# Patient Record
Sex: Male | Born: 1940 | Race: White | Hispanic: No | Marital: Married | Smoking: Former smoker
Health system: Southern US, Community
[De-identification: ages and names within clinical notes are randomized; demographics above are authoritative.]

## PROBLEM LIST (undated history)

## (undated) DIAGNOSIS — K219 Gastro-esophageal reflux disease without esophagitis: Secondary | ICD-10-CM

## (undated) DIAGNOSIS — I10 Essential (primary) hypertension: Secondary | ICD-10-CM

## (undated) DIAGNOSIS — Z5189 Encounter for other specified aftercare: Secondary | ICD-10-CM

## (undated) DIAGNOSIS — H269 Unspecified cataract: Secondary | ICD-10-CM

## (undated) DIAGNOSIS — K635 Polyp of colon: Secondary | ICD-10-CM

## (undated) DIAGNOSIS — D649 Anemia, unspecified: Secondary | ICD-10-CM

## (undated) DIAGNOSIS — E785 Hyperlipidemia, unspecified: Secondary | ICD-10-CM

## (undated) DIAGNOSIS — C6290 Malignant neoplasm of unspecified testis, unspecified whether descended or undescended: Secondary | ICD-10-CM

## (undated) DIAGNOSIS — R413 Other amnesia: Secondary | ICD-10-CM

## (undated) HISTORY — DX: Essential (primary) hypertension: I10

## (undated) HISTORY — DX: Malignant neoplasm of unspecified testis, unspecified whether descended or undescended: C62.90

## (undated) HISTORY — DX: Hyperlipidemia, unspecified: E78.5

## (undated) HISTORY — DX: Unspecified cataract: H26.9

## (undated) HISTORY — DX: Polyp of colon: K63.5

## (undated) HISTORY — DX: Gastro-esophageal reflux disease without esophagitis: K21.9

## (undated) HISTORY — PX: INGUINAL HERNIA REPAIR: SUR1180

## (undated) HISTORY — DX: Anemia, unspecified: D64.9

## (undated) HISTORY — PX: ORCHIECTOMY: SHX2116

## (undated) HISTORY — DX: Other amnesia: R41.3

## (undated) HISTORY — PX: CATARACT EXTRACTION, BILATERAL: SHX1313

## (undated) HISTORY — DX: Encounter for other specified aftercare: Z51.89

---

## 2009-09-27 ENCOUNTER — Encounter: Payer: Self-pay | Admitting: *Deleted

## 2017-10-22 ENCOUNTER — Encounter: Payer: Self-pay | Admitting: Gastroenterology

## 2018-05-14 ENCOUNTER — Encounter: Payer: Self-pay | Admitting: Cardiology

## 2018-05-14 NOTE — Progress Notes (Signed)
Cardiology Office Note   Date:  05/15/2018   ID:  Paul Kelley, DOB 1940-04-19, MRN [id number]  PCP:  Orpah Greek, MD  Cardiologist:   No primary care provider on file. Referring:  Self  No chief complaint on file.     History of Present Illness: Paul Kelley is a 78 y.o. male who is referred by himself for evaluation of shoulder pain.   The patient has had no past cardiac history.  However, he has had stress test in the past because of risk factors and an abnormal EKG by report.  He describes bilateral shoulder discomfort at times.  This is sharp and shooting.  Seems to be sporadic.  He has been evaluated with stress testing and I was able to review outside records that demonstrate a perfusion study with no evidence of ischemia with the most recent being done in 2018.  He also had an ejection fraction that was normal and an echocardiogram that was unremarkable.  He says his symptoms have not changed since that time.  He develops bilateral shoulder discomfort.  He feels some sharp discomfort and it seems to be intermittent and lasting for less than an hour.  He does not describe resting shortness of breath that is substernal.  He has no associated nausea vomiting. He will continue patient no shortness of breath, PND or orthopnea.  He has no weight gain or edema.    Past Medical History:  Diagnosis Date  . GERD (gastroesophageal reflux disease)   . HTN (hypertension)   . Hyperlipidemia   . Pneumonia   . 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 INGUINAL HERNIA REPAIR    . ORCHIECTOMY        Current Outpatient Medications  Medication Sig Dispense Refill  . aspirin EC 81 MG tablet Take 81 mg by mouth daily.    Marland Kitchen lisinopril-hydrochlorothiazide (ZESTORETIC) 20-25 MG tablet Take 1 tablet by mouth daily.    . simvastatin (ZOCOR) 40 MG tablet Take 40 mg by mouth every evening.     No current facility-administered medications  for this visit.     Allergies:   Patient has no allergy information on record.    Social History:  The patient  reports that he quit smoking about 50 years ago. His smoking use included cigarettes. He has never used smokeless tobacco. He reports previous alcohol use.   Family History:  The patient's family history is not on file.    ROS:  Please see the history of present illness.   Otherwise, review of systems are positive for none.   All other systems are reviewed and negative.    PHYSICAL EXAM: VS:  BP (!) 147/71   Pulse (!) 53   Ht 5\' 9"  (1.753 m)   Wt 222 lb 12.8 oz (101.1 kg)   BMI 32.90 kg/m  , BMI Body mass index is 32.9 kg/m. GENERAL:  Well appearing HEENT:  Pupils equal round and reactive, fundi not visualized, oral mucosa unremarkable NECK:  No jugular venous distention, waveform within normal limits, carotid upstroke brisk and symmetric, no bruits, no thyromegaly LYMPHATICS:  No cervical, inguinal adenopathy LUNGS:  Clear to auscultation bilaterally BACK:  No CVA tenderness CHEST:  Unremarkable HEART:  PMI not displaced or sustained,S1 and S2 within normal limits, no S3, no S4, no clicks, no rubs, no murmurs ABD:  Flat, positive bowel sounds normal in frequency in pitch, no  bruits, no rebound, no guarding, no midline pulsatile mass, no hepatomegaly, no splenomegaly EXT:  2 plus pulses throughout, no edema, no cyanosis no clubbing SKIN:  No rashes no nodules NEURO:  Cranial nerves II through XII grossly intact, motor grossly intact throughout PSYCH:  Cognitively intact, oriented to person place and time    EKG:  EKG is ordered today. The ekg ordered today demonstrates sinus rhythm, rate 53, axis within normal limits, intervals within normal limits, no acute ST-T wave changes.   Recent Labs: No results found for requested labs within last 8760 hours.    Lipid Panel No results found for: CHOL, TRIG, HDL, CHOLHDL, VLDL, LDLCALC, LDLDIRECT    Wt Readings from  Last 3 Encounters:  05/15/18 222 lb 12.8 oz (101.1 kg)      Other studies Reviewed: Additional studies/ records that were reviewed today include: Outside records as above.. Review of the above records demonstrates:  Please see elsewhere in the note.     ASSESSMENT AND PLAN:  ABNORMAL EKG: The patient has had the bilateral shoulder discomfort as described.  I do not see an abnormal EKG with his records earlier today.  He has had his symptoms thoroughly evaluated.  He has had negative stress testing recorded above.  The symptoms are quite atypical.  His exam is unremarkable.  Given this I think the pretest probability of obstructive coronary disease as the etiology to his complaints is very low.  I would agree that the testing represents a true negative and that no further cardiovascular testing is suggested.  HTN: His blood pressure is slightly abnormal and I suggested weight loss along with the medications as listed.  OBESITY: We did talk about weight loss as above.   Current medicines are reviewed at length with the patient today.  The patient does not have concerns regarding medicines.  The following changes have been made:  no change  Labs/ tests ordered today include:  No orders of the defined types were placed in this encounter.    Disposition:   FU with me as needed     Signed, Minus Breeding, MD  05/15/2018 3:41 PM    [facility]

## 2018-05-15 ENCOUNTER — Encounter: Payer: Self-pay | Admitting: Cardiology

## 2018-05-15 ENCOUNTER — Other Ambulatory Visit: Payer: Self-pay

## 2018-05-15 ENCOUNTER — Ambulatory Visit (INDEPENDENT_AMBULATORY_CARE_PROVIDER_SITE_OTHER): Payer: Medicare Other | Admitting: Cardiology

## 2018-05-15 VITALS — BP 147/71 | HR 53 | Ht 69.0 in | Wt 222.8 lb

## 2018-05-15 DIAGNOSIS — M25511 Pain in right shoulder: Secondary | ICD-10-CM

## 2018-05-15 DIAGNOSIS — M25512 Pain in left shoulder: Secondary | ICD-10-CM

## 2018-05-15 DIAGNOSIS — G8929 Other chronic pain: Secondary | ICD-10-CM | POA: Insufficient documentation

## 2018-05-15 DIAGNOSIS — I10 Essential (primary) hypertension: Secondary | ICD-10-CM | POA: Diagnosis not present

## 2018-05-15 NOTE — Patient Instructions (Signed)

## 2018-09-24 ENCOUNTER — Encounter: Payer: Self-pay | Admitting: Gastroenterology

## 2018-09-24 ENCOUNTER — Telehealth: Payer: Self-pay | Admitting: Gastroenterology

## 2018-09-24 NOTE — Telephone Encounter (Signed)
Consult scheduled on 10/9 at 9:50am.

## 2018-09-24 NOTE — Telephone Encounter (Signed)
Hi Dr. Rush Landmark, we have received Gi records from this patient. He would like to switch his GI care to you because his wife is also a patient of yours. He had a colon last October 2019 and he states that he is still dealing with bowel problems. Records will be placed on your desk for review. Please advise on scheduling.

## 2018-09-24 NOTE — Telephone Encounter (Signed)
I am willing to see patient in a transfer of care. His records have been partially reviewed and should be available at time of clinic visit.  2019 colonoscopy With diverticulosis in the sigmoid colon. 2 sessile nonbleeding polyps in the cecum removed piecemeal with forceps. Small nonbleeding grade 1 hemorrhoids.  Justice Britain, MD [facility] Office # [phone]

## 2018-10-30 ENCOUNTER — Encounter: Payer: Self-pay | Admitting: Gastroenterology

## 2018-10-30 ENCOUNTER — Other Ambulatory Visit: Payer: Self-pay

## 2018-10-30 ENCOUNTER — Other Ambulatory Visit (INDEPENDENT_AMBULATORY_CARE_PROVIDER_SITE_OTHER): Payer: Medicare Other

## 2018-10-30 ENCOUNTER — Ambulatory Visit (INDEPENDENT_AMBULATORY_CARE_PROVIDER_SITE_OTHER)
Admission: RE | Admit: 2018-10-30 | Discharge: 2018-10-30 | Disposition: A | Discharge status: Home / Self Care | Payer: Medicare Other | Source: Ambulatory Visit | Attending: Gastroenterology | Admitting: Gastroenterology

## 2018-10-30 ENCOUNTER — Ambulatory Visit (INDEPENDENT_AMBULATORY_CARE_PROVIDER_SITE_OTHER): Payer: Medicare Other | Admitting: Gastroenterology

## 2018-10-30 VITALS — BP 124/78 | HR 76 | Temp 98.2°F | Ht 66.5 in | Wt 226.0 lb

## 2018-10-30 DIAGNOSIS — D649 Anemia, unspecified: Secondary | ICD-10-CM | POA: Diagnosis not present

## 2018-10-30 DIAGNOSIS — Z862 Personal history of diseases of the blood and blood-forming organs and certain disorders involving the immune mechanism: Secondary | ICD-10-CM

## 2018-10-30 DIAGNOSIS — R140 Abdominal distension (gaseous): Secondary | ICD-10-CM

## 2018-10-30 DIAGNOSIS — R0789 Other chest pain: Secondary | ICD-10-CM | POA: Diagnosis not present

## 2018-10-30 DIAGNOSIS — Z8719 Personal history of other diseases of the digestive system: Secondary | ICD-10-CM

## 2018-10-30 DIAGNOSIS — K649 Unspecified hemorrhoids: Secondary | ICD-10-CM

## 2018-10-30 DIAGNOSIS — Z860100 Personal history of colon polyps, unspecified: Secondary | ICD-10-CM

## 2018-10-30 DIAGNOSIS — K219 Gastro-esophageal reflux disease without esophagitis: Secondary | ICD-10-CM | POA: Diagnosis not present

## 2018-10-30 DIAGNOSIS — Z87448 Personal history of other diseases of urinary system: Secondary | ICD-10-CM

## 2018-10-30 DIAGNOSIS — R413 Other amnesia: Secondary | ICD-10-CM

## 2018-10-30 LAB — CBC
HCT: 38.5 % — ABNORMAL LOW (ref 39.0–52.0)
Hemoglobin: 12.6 g/dL — ABNORMAL LOW (ref 13.0–17.0)
MCHC: 32.7 g/dL (ref 30.0–36.0)
MCV: 80.5 fl (ref 78.0–100.0)
Platelets: 243 10*3/uL (ref 150.0–400.0)
RBC: 4.78 Mil/uL (ref 4.22–5.81)
RDW: 15.2 % (ref 11.5–15.5)
WBC: 7.7 10*3/uL (ref 4.0–10.5)

## 2018-10-30 LAB — BASIC METABOLIC PANEL
BUN: 19 mg/dL (ref 6–23)
CO2: 30 mEq/L (ref 19–32)
Calcium: 11.1 mg/dL — ABNORMAL HIGH (ref 8.4–10.5)
Chloride: 98 mEq/L (ref 96–112)
Creatinine, Ser: 1.04 mg/dL (ref 0.40–1.50)
GFR: 69.05 mL/min (ref >60.00)
Glucose, Bld: 101 mg/dL — ABNORMAL HIGH (ref 70–99)
Potassium: 3.9 mEq/L (ref 3.5–5.1)
Sodium: 139 mEq/L (ref 135–145)

## 2018-10-30 LAB — PROTIME-INR
INR: 1.1 ratio — ABNORMAL HIGH (ref 0.8–1.0)
Prothrombin Time: 12.6 s (ref 9.6–13.1)

## 2018-10-30 LAB — IBC + FERRITIN
Ferritin: 8 ng/mL — ABNORMAL LOW (ref 22.0–322.0)
Iron: 47 ug/dL (ref 42–165)
Saturation Ratios: 9.3 % — ABNORMAL LOW (ref 20.0–50.0)
Transferrin: 360 mg/dL (ref 212.0–360.0)

## 2018-10-30 LAB — TSH: TSH: 1.37 u[IU]/mL (ref 0.35–4.50)

## 2018-10-30 LAB — LIPASE: Lipase: 13 U/L (ref 11.0–59.0)

## 2018-10-30 LAB — VITAMIN B12: Vitamin B-12: 153 pg/mL — ABNORMAL LOW (ref 211–911)

## 2018-10-30 LAB — FOLATE: Folate: 16.9 ng/mL (ref >5.9)

## 2018-10-30 NOTE — Progress Notes (Signed)
[facility] VISIT   Primary Care Provider Orpah Greek, MD [address] [place] [address] [phone]  Referring Provider Orpah Greek, MD [address], [place] [address] [phone]  Patient Profile: Paul Kelley is a 78 y.o. male with a pmh significant for hypertension, hyperlipidemia, colon polyps (adenomatous), chronic constipation, hemorrhoids, testicular cancer, status post AAA repair.  The patient presents to the [facility] for an evaluation and management of problem(s) noted below:  Problem List 1. Hx of constipation   2. Abdominal distention   3. Bloating   4. Gastroesophageal reflux disease, unspecified whether esophagitis present   5. Atypical chest pain   6. History of anemia   7. Hemorrhoids, unspecified hemorrhoid type   8. Memory loss   9. History of colonic polyps     History of Present Illness This is the patient's first visit to the outpatient [facility] clinic.  He is the husband of 1 of my patients and decided to transfer his care from [place] to [place] in an effort of trying to further optimize his medical health.  The patient's memory is not great and he himself is not able to give to significant amount of history but he is by himself today.  From what I can gather the patient deals with issues of acid reflux and heartburn as well as increased issues of belching and bloating.  He has had chest discomfort at times for which he has taken antacids and that improves the discomfort.  This occurs intermittently over the course of the week.  It is not associated with exertional chest pain.  He has had longstanding constipation and uses the restroom 1-2 times per week.  When the patient begins to try and have a bowel movement he sits for quite a while on the toilet and he may go back several times if nothing comes out.  At times when he gets so hard at trying to have a bowel movement and  straining he may get dizzy clammy and feel faint.  He may have fecal urgency, again and finally will have a bowel movement with significant relief.  He will end up having improvement in bowel distention and bloating once he has his bowel movement.  He has used MiraLAX but not on a consistent basis.  He does not use nonsteroidals on a frequent basis.  He has had an upper endoscopy many years ago.  He has had multiple colonoscopies in the last 10 years as documented below.  GI Review of Systems Positive as above Negative for odynophagia, dysphagia, decreased appetite, early satiety, melena, hematochezia  Review of Systems General: Denies fevers/chills/weight loss HEENT: Denies oral lesions Cardiovascular: Denies chest pain/palpitations Pulmonary: Denies shortness of breath/nocturnal cough Gastroenterological: See HPI Genitourinary: Denies darkened urine  Hematological: Denies easy bruising/bleeding Endocrine: Denies temperature intolerance Dermatological: Denies jaundice Psychological: Mood is stable   Medications Current Outpatient Medications  Medication Sig Dispense Refill   aspirin EC 81 MG tablet Take 81 mg by mouth daily.     lisinopril-hydrochlorothiazide (ZESTORETIC) 20-25 MG tablet Take 1 tablet by mouth daily.     simvastatin (ZOCOR) 40 MG tablet Take 40 mg by mouth every evening.     STOOL SOFTENER 100 MG capsule Take 200 mg by mouth daily.     No current facility-administered medications for this visit.     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   Past Surgical History:  Procedure Laterality Date   INGUINAL HERNIA REPAIR     ORCHIECTOMY     Social History   Socioeconomic History   Marital status: Married    Spouse name: Not on file   Number of children: 3   Years  of education: Not on file   Highest education level: Not on file  Occupational History   Occupation: retired  Scientist, product/process development strain: Not on file   Food insecurity    Worry: Not on file    Inability: Not on file   Transportation needs    Medical: Not on file    Non-medical: Not on file  Tobacco Use   Smoking status: Former Smoker    Types: Cigarettes    Quit date: 05/14/1968    Years since quitting: 50.5   Smokeless tobacco: Never Used  Substance and Sexual Activity   Alcohol use: Not Currently    Frequency: Never   Drug use: Not on file   Sexual activity: Not on file  Lifestyle   Physical activity    Days per week: Not on file    Minutes per session: Not on file   Stress: Not on file  Relationships   Social connections    Talks on phone: Not on file    Gets together: Not on file    Attends religious service: Not on file    Active member of club or organization: Not on file    Attends meetings of clubs or organizations: Not on file    Relationship status: Not on file   Intimate partner violence    Fear of current or ex partner: Not on file    Emotionally abused: Not on file    Physically abused: Not on file    Forced sexual activity: Not on file  Other Topics Concern   Not on file  Social History Narrative   Not on file   Family History  Problem Relation Age of Onset   Liver cancer Mother    Heart disease Father 8       MI   Diabetes Father    Liver cancer Sister    Cancer Brother        type unknown   Heart disease Brother 74       MI   Colon cancer Neg Hx    Esophageal cancer Neg Hx    Inflammatory bowel disease Neg Hx    Pancreatic cancer Neg Hx    Rectal cancer Neg Hx    Stomach cancer Neg Hx    I have reviewed his medical, social, and family history in detail and updated the electronic medical record as necessary.    PHYSICAL EXAMINATION  BP 124/78 (BP Location: Left Arm, Patient Position: Sitting,  Cuff Size: Normal)    Pulse 76    Temp 98.2 F (36.8 C)    Ht 5' 6.5" (1.689 m) Comment: height measured without shoes   Wt 226 lb (102.5 kg)    BMI 35.93 kg/m  Wt Readings from Last 3 Encounters:  10/30/18 226 lb (102.5 kg)  05/15/18 222 lb 12.8 oz (101.1 kg)  GEN: NAD, appears stated age, doesn't appear chronically ill PSYCH: Cooperative, without pressured speech EYE: Conjunctivae pink, sclerae anicteric ENT: MMM, without oral ulcers, no erythema or exudates noted  NECK: Supple CV: RR without R/Gs  RESP: CTAB posteriorly, without wheezing GI: NABS, soft, protuberant, distended, midline surgical scar present, ventral hernia present, without rebound or guarding, unable to appreciate hepatosplenomegaly due to body habitus GU: Perineal exam suggests external hemorrhoids, internal hemorrhoids grade 2, digital rectal exam suggests normal perineal descent and tone, no palpable rectal masses MSK/EXT: Trace bilateral lower extremity edema SKIN: No jaundice NEURO:  Alert & Oriented x 3, no focal deficits   REVIEW OF DATA  I reviewed the following data at the time of this encounter:  GI Procedures and Studies  March 2008 colonoscopy 10 mm cecal polyp removed.  October 2019 colonoscopy Diverticulosis in sigmoid colon of moderate severity. 2 sessile nonbleeding polyps of benign appearance ranging in 2 to 3 mm size of the cecum status post piecemeal polypectomy cold forceps.  Small nonbleeding grade 1 internal hemorrhoids.  Reported history of adenomatous polyps on colonoscopy in 2008, 2011, 2016, 2019.  Laboratory Studies  No laboratories to review in epic  December 2019 outside laboratories scanned into chart WBC 6.2 Hemoglobin 12.4 Hematocrit 38.8 Platelets 225 MCV 85 Sodium 139 Potassium 4.0 Calcium 9.7 BUN/creatinine 16/1.0 AST/ALT 23/30 Alk phos 52 T bili 1.1 Albumin 4.5 Triglycerides 219  Imaging Studies  No relevant studies to review   ASSESSMENT  Mr. Grauberger is a 78  y.o. male with a pmh significant for hypertension, hyperlipidemia, colon polyps (adenomatous), chronic constipation, hemorrhoids, testicular cancer, status post AAA repair.  The patient is seen today for evaluation and management of:  1. Hx of constipation   2. Abdominal distention   3. Bloating   4. Gastroesophageal reflux disease, unspecified whether esophagitis present   5. Atypical chest pain   6. History of anemia   7. Hemorrhoids, unspecified hemorrhoid type   8. Memory loss   9. History of colonic polyps    The patient is hemodynamically stable.  He has multiple issues but we will try and summarize them as best we can.  Looks like he has had longstanding constipation for years.  We are going to optimize and make sure that he takes his MiraLAX as well as potentially Dulcolax and stool softeners on a scheduled basis.  We will give him a trial of Linzess 72 mcg and if he does well could consider increasing the dose to see if this helps.  I suspect he has abdominal bloating and discomfort and pain as a result of getting backed up.  We will get a KUB as well.  He has had a recent colonoscopy showing no obstructive lesion so no need to repeat that currently but will need colon polyp surveillance currently documented for 2024.  The patient's atypical chest discomfort is not clearly GERD related but could be.  He has improvement with antacid therapy.  We will consider the role of PPI therapy.  With the patient's history of anemia based on last laboratories that I can see I think it worthwhile for [place] to repeat and also make sure that he does have iron deficiency that would require an upper endoscopy to be performed.  If he has overt iron deficiency may consider the role of a diagnostic colon as well.  He has a history of memory loss per notation by his wife's documentation and they would like a referral to neurology which I think is reasonable we will place that as well.  All patient questions were  answered, to the best of my ability, and the patient agrees to the aforementioned plan  of action with follow-up as indicated.   PLAN  Laboratories as outlined below KUB to evaluate for fecal burden Constipation regimen #1 -Docusate 200 mg daily -MiraLAX 1-2 times daily and if no bowel movement after 48 hours  -Then Dulcolax 10 mg Constipation regimen #2 trial -Docusate 200 mg daily -Linzess 72 mcg daily Consider diagnostic endoscopic evaluation based on findings of anemia and iron deficiency Neurology referral   Orders Placed This Encounter  Procedures   DG Abd 2 Views   CBC   Basic Metabolic Panel (BMET)   Lipase   TSH   IBC + Ferritin   B12   Folate   INR/PT   Hepatitis C Antibody   Ambulatory referral to Neurology    New Prescriptions   No medications on file   Modified Medications   No medications on file    Planned Follow Up No follow-ups on file.   Justice Britain, MD [facility] Office # [phone]

## 2018-10-30 NOTE — Patient Instructions (Addendum)
Your provider has requested that you go to the basement level for lab work and [employer]  before leaving today. Press "B" on the elevator. The lab is located at the first door on the left as you exit the elevator.  If you are age 78 or older, your body mass index should be between 23-30. Your Body mass index is 35.93 kg/m. If this is out of the aforementioned range listed, please consider follow up with your Primary Care Provider.  If you are age 79 or younger, your body mass index should be between 19-25. Your Body mass index is 35.93 kg/m. If this is out of the aformentioned range listed, please consider follow up with your Primary Care Provider.   Use step stool under feet while having a bowel movement to improve angulation.   Use Miralax daily- disslove 17 grams in at least 8 ounces of water/ juice daily, then increase to twice daily as bowel movements change.   Add dulcolax 200mg  - twice daily if no bowel movement within 48 hours of taking Miralax.   Take one  linzess 72cmg by mouth after having bowel movement. (samples given).   We have sent referral to Neurology- their office will contact you with appointment.  Thank you for choosing me and [facility].  Dr. Rush Landmark

## 2018-10-31 ENCOUNTER — Encounter: Payer: Self-pay | Admitting: Gastroenterology

## 2018-11-01 DIAGNOSIS — R413 Other amnesia: Secondary | ICD-10-CM | POA: Insufficient documentation

## 2018-11-01 DIAGNOSIS — Z860100 Personal history of colon polyps, unspecified: Secondary | ICD-10-CM | POA: Insufficient documentation

## 2018-11-01 DIAGNOSIS — R0789 Other chest pain: Secondary | ICD-10-CM | POA: Insufficient documentation

## 2018-11-01 DIAGNOSIS — R140 Abdominal distension (gaseous): Secondary | ICD-10-CM | POA: Insufficient documentation

## 2018-11-01 DIAGNOSIS — K219 Gastro-esophageal reflux disease without esophagitis: Secondary | ICD-10-CM | POA: Insufficient documentation

## 2018-11-01 DIAGNOSIS — K649 Unspecified hemorrhoids: Secondary | ICD-10-CM | POA: Insufficient documentation

## 2018-11-01 DIAGNOSIS — Z8719 Personal history of other diseases of the digestive system: Secondary | ICD-10-CM | POA: Insufficient documentation

## 2018-11-01 DIAGNOSIS — Z862 Personal history of diseases of the blood and blood-forming organs and certain disorders involving the immune mechanism: Secondary | ICD-10-CM | POA: Insufficient documentation

## 2018-11-02 ENCOUNTER — Other Ambulatory Visit: Payer: Self-pay

## 2018-11-02 DIAGNOSIS — R140 Abdominal distension (gaseous): Secondary | ICD-10-CM

## 2018-11-02 DIAGNOSIS — Z8719 Personal history of other diseases of the digestive system: Secondary | ICD-10-CM

## 2018-11-02 DIAGNOSIS — K219 Gastro-esophageal reflux disease without esophagitis: Secondary | ICD-10-CM

## 2018-11-02 DIAGNOSIS — Z862 Personal history of diseases of the blood and blood-forming organs and certain disorders involving the immune mechanism: Secondary | ICD-10-CM

## 2018-11-02 LAB — HEPATITIS C ANTIBODY
Hepatitis C Ab: NONREACTIVE
SIGNAL TO CUT-OFF: 0.01 (ref <1.00)

## 2018-11-02 MED ORDER — FERROUS GLUCONATE 324 (38 FE) MG PO TABS: 324.0000 mg | ORAL_TABLET | Freq: Every day | ORAL | 3 refills | Status: AC

## 2018-11-02 NOTE — Progress Notes (Signed)
Rx for iron sent to pharmacy 

## 2018-11-23 ENCOUNTER — Telehealth: Payer: Self-pay | Admitting: Gastroenterology

## 2018-11-23 NOTE — Telephone Encounter (Signed)
The pt's wife called to say that they forgot to start Iron and wanted to know if they could wait a few weeks to have labs done.  He has started iron now.  They will come in around the 20th of November. They will be in the area at that time. FYI Dr Rush Landmark

## 2018-11-24 NOTE — Telephone Encounter (Signed)
That will be fine. Thank you. GM

## 2018-12-09 ENCOUNTER — Other Ambulatory Visit (INDEPENDENT_AMBULATORY_CARE_PROVIDER_SITE_OTHER): Payer: Medicare Other

## 2018-12-09 DIAGNOSIS — R140 Abdominal distension (gaseous): Secondary | ICD-10-CM

## 2018-12-09 DIAGNOSIS — Z8719 Personal history of other diseases of the digestive system: Secondary | ICD-10-CM | POA: Diagnosis not present

## 2018-12-09 DIAGNOSIS — Z862 Personal history of diseases of the blood and blood-forming organs and certain disorders involving the immune mechanism: Secondary | ICD-10-CM | POA: Diagnosis not present

## 2018-12-09 DIAGNOSIS — K219 Gastro-esophageal reflux disease without esophagitis: Secondary | ICD-10-CM | POA: Diagnosis not present

## 2018-12-09 LAB — IBC + FERRITIN
Ferritin: 22.2 ng/mL (ref 22.0–322.0)
Iron: 56 ug/dL (ref 42–165)
Saturation Ratios: 13.1 % — ABNORMAL LOW (ref 20.0–50.0)
Transferrin: 305 mg/dL (ref 212.0–360.0)

## 2018-12-09 LAB — COMPREHENSIVE METABOLIC PANEL
ALT: 12 U/L (ref 0–53)
AST: 14 U/L (ref 0–37)
Albumin: 4.2 g/dL (ref 3.5–5.2)
Alkaline Phosphatase: 52 U/L (ref 39–117)
BUN: 19 mg/dL (ref 6–23)
CO2: 32 mEq/L (ref 19–32)
Calcium: 9.7 mg/dL (ref 8.4–10.5)
Chloride: 98 mEq/L (ref 96–112)
Creatinine, Ser: 1.03 mg/dL (ref 0.40–1.50)
GFR: 69.81 mL/min (ref >60.00)
Glucose, Bld: 135 mg/dL — ABNORMAL HIGH (ref 70–99)
Potassium: 3.6 mEq/L (ref 3.5–5.1)
Sodium: 137 mEq/L (ref 135–145)
Total Bilirubin: 0.6 mg/dL (ref 0.2–1.2)
Total Protein: 7.7 g/dL (ref 6.0–8.3)

## 2018-12-09 LAB — CBC
HCT: 38.8 % — ABNORMAL LOW (ref 39.0–52.0)
Hemoglobin: 12.8 g/dL — ABNORMAL LOW (ref 13.0–17.0)
MCHC: 33.1 g/dL (ref 30.0–36.0)
MCV: 80.9 fl (ref 78.0–100.0)
Platelets: 225 10*3/uL (ref 150.0–400.0)
RBC: 4.8 Mil/uL (ref 4.22–5.81)
RDW: 15.9 % — ABNORMAL HIGH (ref 11.5–15.5)
WBC: 6.6 10*3/uL (ref 4.0–10.5)

## 2018-12-09 LAB — VITAMIN B12: Vitamin B-12: 1450 pg/mL — ABNORMAL HIGH (ref 211–911)

## 2018-12-10 ENCOUNTER — Other Ambulatory Visit: Payer: Self-pay

## 2018-12-10 DIAGNOSIS — D509 Iron deficiency anemia, unspecified: Secondary | ICD-10-CM

## 2018-12-10 LAB — CALCIUM, IONIZED: Calcium, Ion: 5.22 mg/dL (ref 4.8–5.6)

## 2018-12-10 LAB — RETICULOCYTES
ABS Retic: 57600 cells/uL (ref 25000–9000)
Retic Ct Pct: 1.2 %

## 2018-12-24 ENCOUNTER — Other Ambulatory Visit: Payer: Self-pay

## 2018-12-24 ENCOUNTER — Ambulatory Visit (INDEPENDENT_AMBULATORY_CARE_PROVIDER_SITE_OTHER): Payer: Medicare Other | Admitting: Neurology

## 2018-12-24 ENCOUNTER — Encounter: Payer: Self-pay | Admitting: Neurology

## 2018-12-24 VITALS — BP 126/68 | HR 59 | Temp 97.4°F | Ht 66.5 in | Wt 225.0 lb

## 2018-12-24 DIAGNOSIS — R413 Other amnesia: Secondary | ICD-10-CM | POA: Diagnosis not present

## 2018-12-24 NOTE — Progress Notes (Signed)
PATIENT: Paul Kelley DOB: 1940/07/02  Chief Complaint  Patient presents with  . Memory Loss    MMSE 26/30 - 6 animals. He is here with his wife, Paul Kelley, to have his declining memory evaluated.  Marland Kitchen PCP    Orpah Greek, MD  . GI MD    Mansouraty, Telford Nab., MD - referring provider     HISTORICAL  Paul Kelley is a 78 year old male, seen in request by his primary care physician Dr. Orpah Greek for evaluation of memory loss, initial evaluation was with his wife Paul Kelley on December 24, 2018.  I have reviewed and summarized the referring note from the referring physician.  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  He denies difficulty handling his job at that time, there was no family history of memory loss  He is going through a lot of stress, both his wife, and daughter are combating stage IV cancer, he was noted to have word finding difficulties, difficulty completing his sentences, when he read, he described difficulty concentrate, and retaining information's.  He tends to repeat himself, he is much less active, sedentary most of the time.  He still drives his wife to chemotherapy every 2 weeks  He had a history of testicular cancer, status post surgery followed by chemotherapy, laboratory evaluation in November 2020 showed CBC, hemoglobin of 12.8, ferritin of 22, normal B12, TSH, CMP   REVIEW OF SYSTEMS: Full 14 system review of systems performed and notable only for as above All other review of systems were negative.  ALLERGIES: No Known Allergies  HOME MEDICATIONS: Current Outpatient Medications  Medication Sig Dispense Refill  . aspirin EC 81 MG tablet Take 81 mg by mouth daily.    . Cyanocobalamin (B-12 PO) Take 1 tablet by mouth daily.    . ferrous gluconate (FERGON) 324 MG tablet Take 1 tablet (324 mg total) by mouth daily with  breakfast. 30 tablet 3  . ferrous sulfate 325 (65 FE) MG EC tablet Take 325 mg by mouth daily.    Marland Kitchen lisinopril-hydrochlorothiazide (ZESTORETIC) 20-25 MG tablet Take 1 tablet by mouth daily.    . simvastatin (ZOCOR) 40 MG tablet Take 40 mg by mouth every evening.    Marland Kitchen STOOL SOFTENER 100 MG capsule Take 200 mg by mouth daily.     No current facility-administered medications for this visit.     PAST MEDICAL HISTORY: Past Medical History:  Diagnosis Date  . Colon polyps   . GERD (gastroesophageal reflux disease)   . HTN (hypertension)   . Hyperlipidemia   . Memory loss   . Testicular cancer ([place])    36 years ago    PAST SURGICAL HISTORY: Past Surgical History:  Procedure Laterality Date  . INGUINAL HERNIA REPAIR    . ORCHIECTOMY      FAMILY HISTORY: Family History  Problem Relation Age of Onset  . Liver cancer Mother   . Heart disease Father 5       MI  . Diabetes Father   . Liver cancer Sister   . Cancer Brother        type unknown  . Heart disease Brother 59       MI  . Colon cancer Neg Hx   . Esophageal cancer Neg Hx   . Inflammatory bowel disease Neg Hx   . Pancreatic cancer Neg  Hx   . Rectal cancer Neg Hx   . Stomach cancer Neg Hx     SOCIAL HISTORY: Social History   Socioeconomic History  . Marital status: Married    Spouse name: Not on file  . Number of children: 3  . Years of education: 66  . Highest education level: High school graduate  Occupational History  . Occupation: retired  Scientific laboratory technician  . Financial resource strain: Not on file  . Food insecurity    Worry: Not on file    Inability: Not on file  . Transportation needs    Medical: Not on file    Non-medical: Not on file  Tobacco Use  . Smoking status: Former Smoker    Types: Cigarettes    Quit date: 05/14/1968    Years since quitting: 50.6  . Smokeless tobacco: Never Used  Substance and Sexual Activity  . Alcohol use: Not Currently    Frequency: Never  . Drug use: Never  . Sexual  activity: Not on file  Lifestyle  . Physical activity    Days per week: Not on file    Minutes per session: Not on file  . Stress: Not on file  Relationships  . Social Herbalist on phone: Not on file    Gets together: Not on file    Attends religious service: Not on file    Active member of club or organization: Not on file    Attends meetings of clubs or organizations: Not on file    Relationship status: Not on file  . Intimate partner violence    Fear of current or ex partner: Not on file    Emotionally abused: Not on file    Physically abused: Not on file    Forced sexual activity: Not on file  Other Topics Concern  . Not on file  Social History Narrative   Lives at home with his wife.   2-3 cups caffeine per day.   Right-handed.     PHYSICAL EXAM   Vitals:   12/24/18 1537  BP: 126/68  Pulse: (!) 59  Temp: (!) 97.4 F (36.3 C)  Weight: 225 lb (102.1 kg)  Height: 5' 6.5" (1.689 m)    Not recorded      Body mass index is 35.77 kg/m.  PHYSICAL EXAMNIATION:  Gen: NAD, conversant, well nourised, well groomed                     Cardiovascular: Regular rate rhythm, no peripheral edema, warm, nontender. Eyes: Conjunctivae clear without exudates or hemorrhage Neck: Supple, no carotid bruits. Pulmonary: Clear to auscultation bilaterally   NEUROLOGICAL EXAM:  MMSE - Mini Mental State Exam 12/24/2018  Orientation to time 4  Orientation to Place 5  Registration 3  Attention/ Calculation 5  Recall 0  Language- name 2 objects 2  Language- repeat 1  Language- follow 3 step command 3  Language- read & follow direction 1  Write a sentence 1  Copy design 1  Total score 26  Animal Naming 6   CRANIAL NERVES: CN II: Visual fields are full to confrontation.  Pupils are round equal and briskly reactive to light. CN III, IV, VI: extraocular movement are normal. No ptosis. CN V: Facial sensation is intact to pinprick in all 3 divisions bilaterally.  Corneal responses are intact.  CN VII: Face is symmetric with normal eye closure and smile. CN VIII: Hearing is normal to causal conversation. CN IX,  X: Palate elevates symmetrically. Phonation is normal. CN XI: Head turning and shoulder shrug are intact CN XII: Tongue is midline with normal movements and no atrophy.  MOTOR: There is no pronator drift of out-stretched arms. Muscle bulk and tone are normal. Muscle strength is normal.  REFLEXES: Reflexes are 1 and symmetric at the biceps, triceps, knees, and absent at ankles. Plantar responses are flexor.  SENSORY: Length dependent decreased to light touch, pinprick and vibratory sensation to mid shin level.  COORDINATION: There is no trunk or limb dysmetria noted.  GAIT/STANCE: He needs push-up to get up from seated position, cautious, difficulty perform tandem walking  DIAGNOSTIC DATA (LABS, IMAGING, TESTING) - I reviewed patient records, labs, notes, testing and imaging myself where available.   ASSESSMENT AND PLAN  Kingdon Lenzini is a 77 y.o. 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 Ph.D.  [facility] [address], [place], [place] [address] Ph: [phone] Fax: [phone]  CC: Orpah Greek, MD

## 2018-12-31 ENCOUNTER — Telehealth: Payer: Self-pay | Admitting: Neurology

## 2018-12-31 NOTE — Telephone Encounter (Signed)
Pts wife Lelon Frohlich is calling in wanting to know if his MRI can be done in [place] due to GI not having an opening until 01/06 and his appt with Dr Krista Blue on 01/04. They stated they do not want to wait a full month or more to be seen

## 2018-12-31 NOTE — Telephone Encounter (Signed)
Orders have been given to Dr. Krista Blue for signature. DW

## 2018-12-31 NOTE — Telephone Encounter (Signed)
I have faxed to the number provided.

## 2018-12-31 NOTE — Telephone Encounter (Signed)
Pt is requesting order to be sent to   [facility] [address] [place] [address] [phone]

## 2019-01-01 NOTE — Telephone Encounter (Signed)
Pt wife(on dpr-Ann Bledsoe) has called stating she has been told by [facility] that they have not received the orders from [facility].  Wife was informed of the entry from 4:05pm on 12-10.  Wife stated [facility] told her they went back even earlier in the day and have no record of the fax.  Wife asked if we could refax.  Wife was informed that unfortunately there is no one in that department until Monday morning.  Phone rep offered to send message as high priority to ensure a response early Monday, wife states this is preventing pt from getting scan, wife was apologized to that no one was available to resend the fax.

## 2019-01-04 NOTE — Telephone Encounter (Signed)
I called [facility] to confirm fax number, I have sent fax again. I called to confirm they received it but the imaging scheduling department does not open until 8am.  DW

## 2019-01-14 ENCOUNTER — Encounter: Payer: Self-pay | Admitting: Neurology

## 2019-01-25 ENCOUNTER — Ambulatory Visit (INDEPENDENT_AMBULATORY_CARE_PROVIDER_SITE_OTHER): Payer: Medicare Other | Admitting: Neurology

## 2019-01-25 ENCOUNTER — Other Ambulatory Visit: Payer: Self-pay

## 2019-01-25 ENCOUNTER — Other Ambulatory Visit: Payer: Medicare Other

## 2019-01-25 ENCOUNTER — Encounter: Payer: Self-pay | Admitting: Neurology

## 2019-01-25 VITALS — BP 133/63 | HR 81 | Temp 97.6°F | Ht 66.5 in | Wt 227.0 lb

## 2019-01-25 DIAGNOSIS — R413 Other amnesia: Secondary | ICD-10-CM | POA: Diagnosis not present

## 2019-01-25 MED ORDER — DONEPEZIL HCL 10 MG PO TABS: 10.0000 mg | ORAL_TABLET | Freq: Every day | ORAL | 4 refills | Status: AC

## 2019-01-25 MED ORDER — MEMANTINE HCL 10 MG PO TABS: 10.0000 mg | ORAL_TABLET | Freq: Two times a day (BID) | ORAL | 4 refills | Status: AC

## 2019-01-25 MED ORDER — DONEPEZIL HCL 10 MG PO TABS: 10.0000 mg | ORAL_TABLET | Freq: Every day | ORAL | 4 refills | Status: DC

## 2019-01-25 NOTE — Progress Notes (Addendum)
PATIENT: Paul Kelley DOB: 01/20/1941  Chief Complaint  Patient presents with  . Memory Loss    He is here with his wife, Paul Kelley. They would like to review the results his MRI brain.  Recent MMSE on 12/24/2018 was 26/30.     HISTORICAL  Paul Kelley is a 79 year old male, seen in request by his primary care physician Dr. Sabra Heck, Dominica Severin for evaluation of memory loss, initial evaluation was with his wife Paul Kelley on December 24, 2018.  I have reviewed and summarized the referring note from the referring physician.  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  He denies difficulty handling his job at that time, there was no family history of memory loss  He is going through a lot of stress, both his wife, and daughter are combating stage IV cancer, he was noted to have word finding difficulties, difficulty completing his sentences, when he read, he described difficulty concentrate, and retaining information's.  He tends to repeat himself, he is much less active, sedentary most of the time.  He still drives his wife to chemotherapy every 2 weeks  He had a history of testicular cancer, status post surgery followed by chemotherapy, laboratory evaluation in November 2020 showed CBC, hemoglobin of 12.8, ferritin of 22, normal B12, TSH, CMP  UPDATE Jan 4th 2021: He continue complains of mild memory loss, we personally reviewed MRI of the brain from [facility], mild generalized atrophy, no acute abnormality  He is under a lot of stress, both his wife and daughter suffered stage IV cancer  REVIEW OF SYSTEMS: Full 14 system review of systems performed and notable only for as above All other review of systems were negative.  ALLERGIES: No Known Allergies  HOME MEDICATIONS: Current Outpatient Medications  Medication Sig Dispense Refill  .  aspirin EC 81 MG tablet Take 81 mg by mouth daily.    . Cyanocobalamin (B-12 PO) Take 1 tablet by mouth daily.    . ferrous gluconate (FERGON) 324 MG tablet Take 1 tablet (324 mg total) by mouth daily with breakfast. 30 tablet 3  . ferrous sulfate 325 (65 FE) MG EC tablet Take 325 mg by mouth daily.    Marland Kitchen lisinopril-hydrochlorothiazide (ZESTORETIC) 20-25 MG tablet Take 1 tablet by mouth daily.    . simvastatin (ZOCOR) 40 MG tablet Take 40 mg by mouth every evening.    Marland Kitchen STOOL SOFTENER 100 MG capsule Take 200 mg by mouth daily.     No current facility-administered medications for this visit.    PAST MEDICAL HISTORY: Past Medical History:  Diagnosis Date  . Colon polyps   . GERD (gastroesophageal reflux disease)   . HTN (hypertension)   . Hyperlipidemia   . Memory loss   . Testicular cancer ([place])    36 years ago    PAST SURGICAL HISTORY: Past Surgical History:  Procedure Laterality Date  . INGUINAL HERNIA REPAIR    . ORCHIECTOMY      FAMILY HISTORY: Family History  Problem Relation Age of Onset  . Liver cancer Mother   . Heart disease Father 23       MI  . Diabetes Father   . Liver cancer Sister   . Cancer Brother        type unknown  . Heart disease Brother 77  MI  . Colon cancer Neg Hx   . Esophageal cancer Neg Hx   . Inflammatory bowel disease Neg Hx   . Pancreatic cancer Neg Hx   . Rectal cancer Neg Hx   . Stomach cancer Neg Hx     SOCIAL HISTORY: Social History   Socioeconomic History  . Marital status: Married    Spouse name: Not on file  . Number of children: 3  . Years of education: 82  . Highest education level: High school graduate  Occupational History  . Occupation: retired  Tobacco Use  . Smoking status: Former Smoker    Types: Cigarettes    Quit date: 05/14/1968    Years since quitting: 50.7  . Smokeless tobacco: Never Used  Substance and Sexual Activity  . Alcohol use: Not Currently  . Drug use: Never  . Sexual activity: Not on  file  Other Topics Concern  . Not on file  Social History Narrative   Lives at home with his wife.   2-3 cups caffeine per day.   Right-handed.   Social Determinants of Health   Financial Resource Strain:   . Difficulty of Paying Living Expenses: Not on file  Food Insecurity:   . Worried About Charity fundraiser in the Last Year: Not on file  . Ran Out of Food in the Last Year: Not on file  Transportation Needs:   . Lack of Transportation (Medical): Not on file  . Lack of Transportation (Non-Medical): Not on file  Physical Activity:   . Days of Exercise per Week: Not on file  . Minutes of Exercise per Session: Not on file  Stress:   . Feeling of Stress : Not on file  Social Connections:   . Frequency of Communication with Friends and Family: Not on file  . Frequency of Social Gatherings with Friends and Family: Not on file  . Attends Religious Services: Not on file  . Active Member of Clubs or Organizations: Not on file  . Attends Archivist Meetings: Not on file  . Marital Status: Not on file  Intimate Partner Violence:   . Fear of Current or Ex-Partner: Not on file  . Emotionally Abused: Not on file  . Physically Abused: Not on file  . Sexually Abused: Not on file     PHYSICAL EXAM   Vitals:   01/25/19 1516  BP: 133/63  Pulse: 81  Temp: 97.6 F (36.4 C)  Weight: 227 lb (103 kg)  Height: 5' 6.5" (1.689 m)    Not recorded      Body mass index is 36.09 kg/m.  PHYSICAL EXAMNIATION:  Gen: NAD, conversant, well nourised, well groomed                     Cardiovascular: Regular rate rhythm, no peripheral edema, warm, nontender. Eyes: Conjunctivae clear without exudates or hemorrhage Neck: Supple, no carotid bruits. Pulmonary: Clear to auscultation bilaterally   NEUROLOGICAL EXAM:  MMSE - Mini Mental State Exam 12/24/2018  Orientation to time 4  Orientation to Place 5  Registration 3  Attention/ Calculation 5  Recall 0  Language- name 2  objects 2  Language- repeat 1  Language- follow 3 step command 3  Language- read & follow direction 1  Write a sentence 1  Copy design 1  Total score 26  Animal Naming 6   CRANIAL NERVES: CN II: Visual fields are full to confrontation.  Pupils are round equal and briskly reactive  to light. CN III, IV, VI: extraocular movement are normal. No ptosis. CN V: Facial sensation is intact to pinprick in all 3 divisions bilaterally. Corneal responses are intact.  CN VII: Face is symmetric with normal eye closure and smile. CN VIII: Hearing is normal to causal conversation. CN IX, X: Palate elevates symmetrically. Phonation is normal. CN XI: Head turning and shoulder shrug are intact CN XII: Tongue is midline with normal movements and no atrophy.  MOTOR: There is no pronator drift of out-stretched arms. Muscle bulk and tone are normal. Muscle strength is normal.  REFLEXES: Reflexes are 1 and symmetric at the biceps, triceps, knees, and absent at ankles. Plantar responses are flexor.  SENSORY: Length dependent decreased to light touch, pinprick and vibratory sensation to mid shin level.  COORDINATION: There is no trunk or limb dysmetria noted.  GAIT/STANCE: He needs push-up to get up from seated position, cautious, difficulty perform tandem walking  DIAGNOSTIC DATA (LABS, IMAGING, TESTING) - I reviewed patient records, labs, notes, testing and imaging myself where available.   ASSESSMENT AND PLAN  Paul Kelley is a 79 y.o. 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 Ph.D.  [facility] [address] [place], [place] [address] Ph: [phone] Fax: [phone]  CC: Orpah Greek, MD  Addendum: MRI of the brain with without contrast from [facility] center on January 14, 2019, no acute abnormality, mild white matter T2 hyperintensity, consistent with small vessel disease, and age-related global atrophy.

## 2019-01-27 ENCOUNTER — Other Ambulatory Visit (INDEPENDENT_AMBULATORY_CARE_PROVIDER_SITE_OTHER): Payer: Medicare Other

## 2019-01-27 ENCOUNTER — Other Ambulatory Visit: Payer: Medicare Other

## 2019-01-27 ENCOUNTER — Telehealth: Payer: Self-pay | Admitting: Neurology

## 2019-01-27 DIAGNOSIS — D509 Iron deficiency anemia, unspecified: Secondary | ICD-10-CM

## 2019-01-27 LAB — CBC WITH DIFFERENTIAL/PLATELET
Basophils Absolute: 0.1 10*3/uL (ref 0.0–0.1)
Basophils Relative: 0.8 % (ref 0.0–3.0)
Eosinophils Absolute: 0.3 10*3/uL (ref 0.0–0.7)
Eosinophils Relative: 4.2 % (ref 0.0–5.0)
HCT: 38.1 % — ABNORMAL LOW (ref 39.0–52.0)
Hemoglobin: 12.4 g/dL — ABNORMAL LOW (ref 13.0–17.0)
Lymphocytes Relative: 29.4 % (ref 12.0–46.0)
Lymphs Abs: 1.9 10*3/uL (ref 0.7–4.0)
MCHC: 32.6 g/dL (ref 30.0–36.0)
MCV: 82.4 fl (ref 78.0–100.0)
Monocytes Absolute: 0.6 10*3/uL (ref 0.1–1.0)
Monocytes Relative: 9.6 % (ref 3.0–12.0)
Neutro Abs: 3.7 10*3/uL (ref 1.4–7.7)
Neutrophils Relative %: 56 % (ref 43.0–77.0)
Platelets: 291 10*3/uL (ref 150.0–400.0)
RBC: 4.63 Mil/uL (ref 4.22–5.81)
RDW: 15.3 % (ref 11.5–15.5)
WBC: 6.6 10*3/uL (ref 4.0–10.5)

## 2019-01-27 LAB — FERRITIN: Ferritin: 38.1 ng/mL (ref 22.0–322.0)

## 2019-01-27 LAB — IRON: Iron: 104 ug/dL (ref 42–165)

## 2019-01-27 NOTE — Telephone Encounter (Signed)
I spoke to the patient and provided him with the MRI brain results below.  He verbalized understanding.  He will continue his medications as prescribed and keep his follow up appt.

## 2019-01-27 NOTE — Telephone Encounter (Signed)
Please call patient MRI of the brain with without contrast from [facility] on January 14, 2019, no acute abnormality, mild white matter T2 hyperintensity, consistent with small vessel disease, and age-related global atrophy.

## 2019-01-28 LAB — IRON, TOTAL/TOTAL IRON BINDING CAP
%SAT: 31 % (calc) (ref 20–48)
Iron: 103 ug/dL (ref 50–180)
TIBC: 333 mcg/dL (calc) (ref 250–425)

## 2019-01-29 ENCOUNTER — Ambulatory Visit: Payer: Medicare Other | Admitting: Gastroenterology

## 2019-03-05 ENCOUNTER — Encounter: Payer: Self-pay | Admitting: Gastroenterology

## 2019-03-05 ENCOUNTER — Other Ambulatory Visit (INDEPENDENT_AMBULATORY_CARE_PROVIDER_SITE_OTHER): Payer: Medicare Other

## 2019-03-05 ENCOUNTER — Ambulatory Visit (INDEPENDENT_AMBULATORY_CARE_PROVIDER_SITE_OTHER): Payer: Medicare Other | Admitting: Gastroenterology

## 2019-03-05 VITALS — BP 138/70 | HR 76 | Temp 97.7°F | Ht 66.5 in | Wt 223.4 lb

## 2019-03-05 DIAGNOSIS — Z8719 Personal history of other diseases of the digestive system: Secondary | ICD-10-CM

## 2019-03-05 DIAGNOSIS — D509 Iron deficiency anemia, unspecified: Secondary | ICD-10-CM | POA: Diagnosis not present

## 2019-03-05 DIAGNOSIS — Z86010 Personal history of colonic polyps: Secondary | ICD-10-CM

## 2019-03-05 DIAGNOSIS — E611 Iron deficiency: Secondary | ICD-10-CM

## 2019-03-05 LAB — IBC + FERRITIN
Ferritin: 41.7 ng/mL (ref 22.0–322.0)
Iron: 58 ug/dL (ref 42–165)
Saturation Ratios: 15.1 % — ABNORMAL LOW (ref 20.0–50.0)
Transferrin: 275 mg/dL (ref 212.0–360.0)

## 2019-03-05 LAB — CBC
HCT: 39.7 % (ref 39.0–52.0)
Hemoglobin: 13.1 g/dL (ref 13.0–17.0)
MCHC: 33 g/dL (ref 30.0–36.0)
MCV: 83.7 fl (ref 78.0–100.0)
Platelets: 248 10*3/uL (ref 150.0–400.0)
RBC: 4.75 Mil/uL (ref 4.22–5.81)
RDW: 14.9 % (ref 11.5–15.5)
WBC: 8.2 10*3/uL (ref 4.0–10.5)

## 2019-03-05 NOTE — Patient Instructions (Addendum)
Toileting tips to help with your constipation - Drink at least 64 ounces of water/liquid per day. - Establish a time to try to move your bowels every day.  For many people, this is after a cup of coffee. - Sit all of the way back on the toilet keeping your back fairly straight and lean forward bending from your hips, resting your forearms on your thighs.  Raising your feet with a step stool can be helpful. - Relax the rectum feeling it bulge toward the toilet water.  If you feel your rectum raising toward your body, you are contracting rather than relaxing. - Breathe in and slowly exhale. "Belly breath" by expanding your belly towards your belly button. Keep belly expanded as you gently direct pressure down and back to the anus.  A low pitched GRRR sound can assist with increasing intra-abdominal pressure.  - Repeat 3-4 times. If unsuccessful, contract the pelvic floor to restore normal tone and get off the toilet.  Avoid excessive straining. - To reduce excessive wiping by teaching your anus to normally contract, place hands on outer aspect of knees and resist knee movement outward.  Hold 5-10 second then place hands just inside of knees and resist inward movement of knees.  Hold 5 seconds.  Repeat a few times each way.  Continue Dulcolax twice daily as you are doing.  Consider a step stool/squatty potty to improve the angle of rectal vault to help with having bowel movement.    Your provider has requested that you go to the basement level for lab work before leaving today. Press "B" on the elevator. The lab is located at the first door on the left as you exit the elevator.  Thank you for choosing me and [facility].  Dr. Rush Landmark

## 2019-03-06 ENCOUNTER — Encounter: Payer: Self-pay | Admitting: Gastroenterology

## 2019-03-06 DIAGNOSIS — E611 Iron deficiency: Secondary | ICD-10-CM | POA: Insufficient documentation

## 2019-03-06 NOTE — Progress Notes (Signed)
GASTROENTEROLOGY OUTPATIENT CLINIC VISIT   Primary Care Provider Orpah Greek, MD [address], [address] [place] [address] [phone]  Patient Profile: Paul Kelley is a 79 y.o. male with a pmh significant for hypertension, hyperlipidemia, colon polyps (adenomatous), chronic constipation, hemorrhoids, testicular cancer, status post AAA repair.  The patient presents to the [facility] for an evaluation and management of problem(s) noted below:  Problem List 1. Iron deficiency   2. Hx of constipation   3. History of colonic polyps     History of Present Illness Please see initial consultation note for full details of HPI.    Interval History The patient has recently been seen by neurology and initiated on Namenda.  He seems to be feeling better with this and believes that he is previous memory deficits and increasing memory loss have stabilized if not improved.  He is very happy with this.  The patient states that his bowel habits are actually doing well at this time.  He is taking 2 Dulcolax in the evening daily and by doing that he is having 1 bowel movement daily.  He is happy with this.  He does strain at times.  He has not bought a Physiological scientist as of yet because he did not recall [place] discussing this previously.  Patient had repeat iron studies without a blood count performed which still showed a mild amount of iron deficiency.  Patient otherwise feels well.  Somewhat upset about the potential upcoming casino that may be built in [place] but hopes that it will bring good jobs for the people of the abdomen.  No other significant GI symptoms at this time.  GI Review of Systems Positive as above Negative for dysphagia, odynophagia, early satiety, melena, hematochezia  Review of Systems General: Denies fevers/chills/weight loss Cardiovascular: Denies chest pain Pulmonary: Denies shortness of breath Gastroenterological: See HPI Genitourinary: Denies  darkened urine  Hematological: Denies easy bruising/bleeding Dermatological: Denies jaundice Psychological: Mood is stable   Medications Current Outpatient Medications  Medication Sig Dispense Refill  . aspirin EC 81 MG tablet Take 81 mg by mouth daily.    . Cyanocobalamin (B-12 PO) Take 1 tablet by mouth daily.    Marland Kitchen donepezil (ARICEPT) 10 MG tablet Take 1 tablet (10 mg total) by mouth at bedtime. 90 tablet 4  . ferrous gluconate (FERGON) 324 MG tablet Take 1 tablet (324 mg total) by mouth daily with breakfast. 30 tablet 3  . ferrous sulfate 325 (65 FE) MG EC tablet Take 325 mg by mouth daily.    Marland Kitchen lisinopril-hydrochlorothiazide (ZESTORETIC) 20-25 MG tablet Take 1 tablet by mouth daily.    . memantine (NAMENDA) 10 MG tablet Take 1 tablet (10 mg total) by mouth 2 (two) times daily. 180 tablet 4  . simvastatin (ZOCOR) 40 MG tablet Take 40 mg by mouth every evening.    Marland Kitchen STOOL SOFTENER 100 MG capsule Take 200 mg by mouth daily.     No current facility-administered medications for this visit.    Allergies No Known Allergies  Histories Past Medical History:  Diagnosis Date  . Colon polyps   . GERD (gastroesophageal reflux disease)   . HTN (hypertension)   . Hyperlipidemia   . Memory loss   . Testicular cancer ([place])    36 years ago   Past Surgical History:  Procedure Laterality Date  . INGUINAL HERNIA REPAIR    . ORCHIECTOMY     Social History   Socioeconomic History  . Marital status:  Married    Spouse name: Not on file  . Number of children: 3  . Years of education: 23  . Highest education level: High school graduate  Occupational History  . Occupation: retired  Tobacco Use  . Smoking status: Former Smoker    Types: Cigarettes    Quit date: 05/14/1968    Years since quitting: 50.8  . Smokeless tobacco: Never Used  Substance and Sexual Activity  . Alcohol use: Not Currently  . Drug use: Never  . Sexual activity: Not on file  Other Topics Concern  . Not on file    Social History Narrative   Lives at home with his wife.   2-3 cups caffeine per day.   Right-handed.   Social Determinants of Health   Financial Resource Strain:   . Difficulty of Paying Living Expenses: Not on file  Food Insecurity:   . Worried About Charity fundraiser in the Last Year: Not on file  . Ran Out of Food in the Last Year: Not on file  Transportation Needs:   . Lack of Transportation (Medical): Not on file  . Lack of Transportation (Non-Medical): Not on file  Physical Activity:   . Days of Exercise per Week: Not on file  . Minutes of Exercise per Session: Not on file  Stress:   . Feeling of Stress : Not on file  Social Connections:   . Frequency of Communication with Friends and Family: Not on file  . Frequency of Social Gatherings with Friends and Family: Not on file  . Attends Religious Services: Not on file  . Active Member of Clubs or Organizations: Not on file  . Attends Archivist Meetings: Not on file  . Marital Status: Not on file  Intimate Partner Violence:   . Fear of Current or Ex-Partner: Not on file  . Emotionally Abused: Not on file  . Physically Abused: Not on file  . Sexually Abused: Not on file   Family History  Problem Relation Age of Onset  . Liver cancer Mother   . Heart disease Father 41       MI  . Diabetes Father   . Liver cancer Sister   . Cancer Brother        type unknown  . Heart disease Brother 42       MI  . Colon cancer Neg Hx   . Esophageal cancer Neg Hx   . Inflammatory bowel disease Neg Hx   . Pancreatic cancer Neg Hx   . Rectal cancer Neg Hx   . Stomach cancer Neg Hx    I have reviewed his medical, social, and family history in detail and updated the electronic medical record as necessary.    PHYSICAL EXAMINATION  BP 138/70 (BP Location: Left Arm, Patient Position: Sitting, Cuff Size: Normal)   Pulse 76   Temp 97.7 F (36.5 C)   Ht 5' 6.5" (1.689 m)   Wt 223 lb 6 oz (101.3 kg)   BMI 35.51 kg/m   Wt Readings from Last 3 Encounters:  03/05/19 223 lb 6 oz (101.3 kg)  01/25/19 227 lb (103 kg)  12/24/18 225 lb (102.1 kg)  GEN: NAD, appears stated age, doesn't appear chronically ill PSYCH: Cooperative, without pressured speech EYE: Conjunctivae pink, sclerae anicteric ENT: MMM CV: RR without R/Gs  RESP: CTAB posteriorly, without wheezing GI: NABS, soft, protuberant, distended, midline surgical scar present, ventral hernia present, without rebound or guarding MSK/EXT: Trace bilateral lower extremity edema  SKIN: No jaundice NEURO:  Alert & Oriented x 3, no focal deficits   REVIEW OF DATA  I reviewed the following data at the time of this encounter:  GI Procedures and Studies  Previously reviewed  Laboratory Studies  Reviewed those labs in epic  Imaging Studies  No relevant studies to review   ASSESSMENT  Mr. Benzing is a 79 y.o. male with a pmh significant for hypertension, hyperlipidemia, colon polyps (adenomatous), chronic constipation, hemorrhoids, testicular cancer, status post AAA repair.  The patient is seen today for evaluation and management of:  1. Iron deficiency   2. Hx of constipation   3. History of colonic polyps    The patient is clinically and hemodynamically stable.  He is doing well with twice daily Dulcolax and having a bowel movement on a daily basis.  We will continue him on this particular regimen for now as it seems to be doing well overall.  We will see how his iron indices and blood counts look.  If the patient has a relative iron deficiency still while being on iron therapy, and then we will need to perform an upper endoscopic evaluation as well as repeat colonoscopic evaluation just to ensure we are not missing any lesions or masses or reasons for iron deficiency.  If that work-up is unremarkable and he continues to mount iron deficiency then we will need to consider a video capsule endoscopy.  He will continue oral iron once daily although upon  completion of this note we noted that he was on 2 separate iron supplements and we will reach out to them next week in order to ensure which iron supplement he is taking or if he truly is taking twice daily iron.  We went over the use of a squatty potty or stepstool as well as a toileting technique as noted below in an effort of trying to decrease his straining from his chronic constipation.  We will hold on pelvic floor retraining and biofeedback for now but will keep that in mind as well as an anorectal manometry.  All patient questions were answered, to the best of my ability, and the patient agrees to the aforementioned plan of action with follow-up as indicated.   PLAN  Laboratories as outlined below If evidence of iron deficiency then will pursue an EGD/colonoscopy to further evaluate this Constipation regimen #1 -Dulcolax 5 to 10 mg daily -Docusate 200 mg daily -Consider addition of MiraLAX 1-2 times daily and if no bowel movement after 24 hours Consider purchasing a stepstool for the restroom Toileting techniques given to patient in patient instructions   Orders Placed This Encounter  Procedures  . CBC  . IBC + Ferritin    New Prescriptions   No medications on file   Modified Medications   No medications on file    Planned Follow Up No follow-ups on file.   Total Time in Face-to-Face and in Coordination of Care for patient including independent/personal interpretation/review of prior testing, medical history, examination, medication adjustment, communicating results with the patient directly, and documentation with the EHR is greater than 25 minutes.   Justice Britain, MD [facility] Office # [phone]

## 2019-04-16 ENCOUNTER — Telehealth: Payer: Self-pay

## 2019-04-16 DIAGNOSIS — D509 Iron deficiency anemia, unspecified: Secondary | ICD-10-CM

## 2019-04-16 NOTE — Telephone Encounter (Signed)
Pt came in office today with wife. Recommendations from previous labs as per Dr Rush Landmark- pt needs to be scheduled for Endo/Colon in the [facility]. Pt scheduled for 05/18/19@ 1:30pm. Pt instructed in office today. Sample of Suprep given to pt.

## 2019-05-18 ENCOUNTER — Encounter: Payer: Self-pay | Admitting: Gastroenterology

## 2019-05-18 ENCOUNTER — Ambulatory Visit (AMBULATORY_SURGERY_CENTER): Payer: Medicare Other | Admitting: Gastroenterology

## 2019-05-18 ENCOUNTER — Other Ambulatory Visit: Payer: Self-pay

## 2019-05-18 VITALS — BP 125/72 | HR 61 | Temp 96.2°F | Resp 20 | Ht 66.5 in | Wt 223.0 lb

## 2019-05-18 DIAGNOSIS — D120 Benign neoplasm of cecum: Secondary | ICD-10-CM | POA: Diagnosis present

## 2019-05-18 DIAGNOSIS — K3189 Other diseases of stomach and duodenum: Secondary | ICD-10-CM

## 2019-05-18 DIAGNOSIS — D509 Iron deficiency anemia, unspecified: Secondary | ICD-10-CM

## 2019-05-18 DIAGNOSIS — K222 Esophageal obstruction: Secondary | ICD-10-CM

## 2019-05-18 DIAGNOSIS — K449 Diaphragmatic hernia without obstruction or gangrene: Secondary | ICD-10-CM | POA: Diagnosis not present

## 2019-05-18 DIAGNOSIS — K5730 Diverticulosis of large intestine without perforation or abscess without bleeding: Secondary | ICD-10-CM | POA: Diagnosis not present

## 2019-05-18 DIAGNOSIS — K642 Third degree hemorrhoids: Secondary | ICD-10-CM

## 2019-05-18 DIAGNOSIS — K2970 Gastritis, unspecified, without bleeding: Secondary | ICD-10-CM

## 2019-05-18 MED ORDER — SODIUM CHLORIDE 0.9 % IV SOLN: 500.0000 mL | Freq: Once | INTRAVENOUS | Status: DC

## 2019-05-18 MED ORDER — OMEPRAZOLE 40 MG PO CPDR: 40.0000 mg | DELAYED_RELEASE_CAPSULE | Freq: Every day | ORAL | 3 refills | Status: DC

## 2019-05-18 NOTE — Op Note (Signed)
[facility] Patient Name: Paul Kelley Procedure Date: 05/18/2019 1:31 PM MRN: [id number] Endoscopist: Justice Britain , MD Age: 79 Referring MD:  Date of Birth: [birth year] Gender: Male Account #: [id number] Procedure:                Upper GI endoscopy Indications:              Iron deficiency anemia Medicines:                Monitored Anesthesia Care Procedure:                Pre-Anesthesia Assessment:                           - Prior to the procedure, a History and Physical                            was performed, and patient medications and                            allergies were reviewed. The patient's tolerance of                            previous anesthesia was also reviewed. The risks                            and benefits of the procedure and the sedation                            options and risks were discussed with the patient.                            All questions were answered, and informed consent                            was obtained. Prior Anticoagulants: The patient has                            taken no previous anticoagulant or antiplatelet                            agents except for aspirin. ASA Grade Assessment:                            III - A patient with severe systemic disease. After                            reviewing the risks and benefits, the patient was                            deemed in satisfactory condition to undergo the                            procedure.  After obtaining informed consent, the endoscope was                            passed under direct vision. Throughout the                            procedure, the patient's blood pressure, pulse, and                            oxygen saturations were monitored continuously. The                            Endoscope was introduced through the mouth, and                            advanced to the second part of duodenum. The upper                   GI endoscopy was accomplished without difficulty.                            The patient tolerated the procedure. Scope In: Scope Out: Findings:                 White nummular lesions were noted in the mid                            esophagus. Biopsies were taken with a cold forceps                            for histology to rule out Candida.                           No gross lesions were noted in the entire esophagus                            otherwise.                           A non-obstructing and mild Schatzki ring was found                            at the gastroesophageal junction.                           A 3 cm hiatal hernia was present.                           Striped mildly erythematous mucosa without bleeding                            was found in the gastric body and in the gastric                            antrum. Biopsies were taken with a cold forceps for  histology and Helicobacter pylori testing.                           A moderate narrowing was found in the D1/D2 angle                            of the duodenum but was able to be traversed with                            ease with the upper EGD scope.                           No gross mucosal lesions were noted in the duodenal                            bulb, in the first portion of the duodenum and in                            the second portion of the duodenum. Biopsies for                            histology were taken with a cold forceps for                            evaluation of celiac disease and HP evaluation. Complications:            No immediate complications. Estimated Blood Loss:     Estimated blood loss was minimal. Impression:               - White nummular lesions in esophageal mucosa.                            Biopsied to rule out Candida. Otherwise no gross                            lesions in esophagus.                           - Schatzki  ring noted.                           - Hiatal hernia noted.                           - Erythematous mucosa in the gastric body and                            antrum. Biopsied.                           - Moderate narrowing in the D1/D2 angle noted,                            traversed with ease.                           -  No gross lesions in the duodenal bulb, in the                            first portion of the duodenum and in the second                            portion of the duodenum. Biopsied. Recommendation:           - Proceed to scheduled colonoscopy.                           - Observe patient's clinical course.                           - Await pathology results.                           - Recommend starting Omeprazole 40 mg daily while                            awaiting biopsy results.                           - Consider UGI series to evaluate the upper GI                            tract. Early satiety has not been an issue for                            patient but can discuss further in future. Query                            role of dilation of the duodenum if necessary.                           - The findings and recommendations were discussed                            with the patient.                           - The findings and recommendations were discussed                            with the patient's family. Justice Britain, MD 05/18/2019 3:01:16 PM

## 2019-05-18 NOTE — Patient Instructions (Addendum)
Please, read all of the handouts given to you by your recovery room nurse.  Thank-you for choosing [place] for your healthcare needs today.  Be sure to take your new medication (omeprazole) once daily before meals.  YOU HAD AN ENDOSCOPIC PROCEDURE TODAY AT [facility]:   Refer to the procedure report that was given to you for any specific questions about what was found during the examination.  If the procedure report does not answer your questions, please call your gastroenterologist to clarify.  If you requested that your care partner not be given the details of your procedure findings, then the procedure report has been included in a sealed envelope for you to review at your convenience later.  YOU SHOULD EXPECT: Some feelings of bloating in the abdomen. Passage of more gas than usual.  Walking can help get rid of the air that was put into your GI tract during the procedure and reduce the bloating. If you had a lower endoscopy (such as a colonoscopy or flexible sigmoidoscopy) you may notice spotting of blood in your stool or on the toilet paper. If you underwent a bowel prep for your procedure, you may not have a normal bowel movement for a few days.  Please Note:  You might notice some irritation and congestion in your nose or some drainage.  This is from the oxygen used during your procedure.  There is no need for concern and it should clear up in a day or so.  SYMPTOMS TO REPORT IMMEDIATELY:   Following lower endoscopy (colonoscopy or flexible sigmoidoscopy):  Excessive amounts of blood in the stool  Significant tenderness or worsening of abdominal pains  Swelling of the abdomen that is new, acute  Fever of 100F or higher   Following upper endoscopy (EGD)  Vomiting of blood or coffee ground material  New chest pain or pain under the shoulder blades  Painful or persistently difficult swallowing  New shortness of breath  Fever of 100F or higher  Black, tarry-looking  stools  For urgent or emergent issues, a gastroenterologist can be reached at any hour by calling [phone]. Do not use MyChart messaging for urgent concerns.    DIET:  We do recommend a small meal at first, but then you may proceed to your regular diet.  Drink plenty of fluids but you should avoid alcoholic beverages for 24 hours.  Try to increase the fiber in your diet, and drink plenty of water.  Use fiber con once daily per Dr. Rush Landmark.  ACTIVITY:  You should plan to take it easy for the rest of today and you should NOT DRIVE or use heavy machinery until tomorrow (because of the sedation medicines used during the test).    FOLLOW UP: Our staff will call the number listed on your records 48-72 hours following your procedure to check on you and address any questions or concerns that you may have regarding the information given to you following your procedure. If we do not reach you, we will leave a message.  We will attempt to reach you two times.  During this call, we will ask if you have developed any symptoms of COVID 19. If you develop any symptoms (ie: fever, flu-like symptoms, shortness of breath, cough etc.) before then, please call [phone].  If you test positive for Covid 19 in the 2 weeks post procedure, please call and report this information to [place].    If any biopsies were taken you will be contacted by  phone or by letter within the next 1-3 weeks.  Please call us at [phone] if you have not heard about the biopsies in 3 weeks.    SIGNATURES/CONFIDENTIALITY: You and/or your care partner have signed paperwork which will be entered into your electronic medical record.  These signatures attest to the fact that that the information above on your After Visit Summary has been reviewed and is understood.  Full responsibility of the confidentiality of this discharge information lies with you and/or your care-partner.

## 2019-05-18 NOTE — Progress Notes (Signed)
A/ox3, pleased with MAC, report to RN 

## 2019-05-18 NOTE — Op Note (Signed)
[place] Patient Name: Paul Kelley Procedure Date: 05/18/2019 1:30 PM MRN: [id number] Endoscopist: Justice Britain , MD Age: 79 Referring MD:  Date of Birth: [birth year] Gender: Male Account #: [id number] Procedure:                Colonoscopy Indications:              Iron deficiency anemia Medicines:                Monitored Anesthesia Care Procedure:                Pre-Anesthesia Assessment:                           - Prior to the procedure, a History and Physical                            was performed, and patient medications and                            allergies were reviewed. The patient's tolerance of                            previous anesthesia was also reviewed. The risks                            and benefits of the procedure and the sedation                            options and risks were discussed with the patient.                            All questions were answered, and informed consent                            was obtained. Prior Anticoagulants: The patient has                            taken no previous anticoagulant or antiplatelet                            agents except for aspirin. ASA Grade Assessment:                            III - A patient with severe systemic disease. After                            reviewing the risks and benefits, the patient was                            deemed in satisfactory condition to undergo the                            procedure.  After obtaining informed consent, the colonoscope                            was passed under direct vision. Throughout the                            procedure, the patient's blood pressure, pulse, and                            oxygen saturations were monitored continuously. The                            Colonoscope was introduced through the anus and                            advanced to the 4 cm into the ileum. The    colonoscopy was performed without difficulty. The                            patient tolerated the procedure. The quality of the                            bowel preparation was good. The terminal ileum,                            ileocecal valve, appendiceal orifice, and rectum                            were photographed. Scope In: 2:33:11 PM Scope Out: 2:49:41 PM Scope Withdrawal Time: 0 hours 11 minutes 31 seconds  Total Procedure Duration: 0 hours 16 minutes 30 seconds  Findings:                 The digital rectal exam findings include                            hemorrhoids. Pertinent negatives include no                            palpable rectal lesions.                           The terminal ileum and ileocecal valve appeared                            normal.                           Two small angioectasias with typical arborization                            were found in the rectum and in the cecum.                           A 3 mm polyp was found in the cecum. The polyp was  sessile. The polyp was removed with a cold snare.                            Resection and retrieval were complete.                           Multiple small-mouthed diverticula were found in                            the recto-sigmoid colon, sigmoid colon and                            descending colon.                           A mild narrowing associated with diverticulosis was                            found in the sigmoid colon and was traversed with                            relative ease after fluid infusion.                           Normal mucosa was found in the entire colon                            otherwise.                           Non-bleeding non-thrombosed external and internal                            hemorrhoids were found during retroflexion, during                            perianal exam and during digital exam. The                             hemorrhoids were Grade III (internal hemorrhoids                            that prolapse but require manual reduction). Complications:            No immediate complications. Estimated Blood Loss:     Estimated blood loss was minimal. Impression:               - Hemorrhoids found on digital rectal exam.                           - The examined portion of the ileum was normal.                           - Two colonic angioectasias.                           -  One 3 mm polyp in the cecum, removed with a cold                            snare. Resected and retrieved.                           - Diverticulosis in the recto-sigmoid colon, in the                            sigmoid colon and in the descending colon.                           - Mild narrowing noted in setting of diverticulosis                            in the sigmoid colon.                           - Normal mucosa in the entire examined colon                            otherwise.                           - Non-bleeding non-thrombosed external and internal                            hemorrhoids. Recommendation:           - The patient will be observed post-procedure,                            until all discharge criteria are met.                           - Discharge patient to home.                           - Patient has a contact number available for                            emergencies. The signs and symptoms of potential                            delayed complications were discussed with the                            patient. Return to normal activities tomorrow.                            Written discharge instructions were provided to the                            patient.                           -  High fiber diet.                           - Use FiberCon 1 tablet PO daily.                           - Continue present medications.                           - Await pathology results.                           -  Repeat colonoscopy in 7 years for surveillance                            based on pathology results but age likely suggests                            we would not continue unless health remains                            excellent.                           - The findings and recommendations were discussed                            with the patient.                           - The findings and recommendations were discussed                            with the patient's family.                           - If IDA persists, then should consider IV Iron                            infusions as well as potentially repeat Colonoscopy                            in the hospital-based setting for APC of the                            Cecal/Rectal Angioectasias. Justice Britain, MD 05/18/2019 3:06:50 PM

## 2019-05-18 NOTE — Progress Notes (Signed)
History reviewed today  Temp JB, VS CW  

## 2019-05-20 ENCOUNTER — Telehealth: Payer: Self-pay

## 2019-05-20 NOTE — Telephone Encounter (Signed)
  Follow up Call-  Call back number 05/18/2019  Post procedure Call Back phone  # [phone]  Permission to leave phone message Yes  Some recent data might be hidden     Patient questions:  Do you have a fever, pain , or abdominal swelling? No. Pain Score  0 *  Have you tolerated food without any problems? Yes.    Have you been able to return to your normal activities? Yes.    Do you have any questions about your discharge instructions: Diet   No. Medications  No. Follow up visit  No.  Do you have questions or concerns about your Care? No.  Actions: * If pain score is 4 or above: No action needed, pain <4.  1. Have you developed a fever since your procedure? no  2.   Have you had an respiratory symptoms (SOB or cough) since your procedure? no  3.   Have you tested positive for COVID 19 since your procedure no  4.   Have you had any family members/close contacts diagnosed with the COVID 19 since your procedure?  no   If yes to any of these questions please route to Joylene John, RN and Erenest Rasher, RN

## 2019-05-20 NOTE — Telephone Encounter (Signed)
Left message on follow up call. 

## 2019-05-23 ENCOUNTER — Encounter: Payer: Self-pay | Admitting: Gastroenterology

## 2019-05-25 ENCOUNTER — Other Ambulatory Visit: Payer: Self-pay

## 2019-05-25 DIAGNOSIS — R933 Abnormal findings on diagnostic imaging of other parts of digestive tract: Secondary | ICD-10-CM

## 2019-05-25 NOTE — Progress Notes (Signed)
[facility]

## 2019-06-02 ENCOUNTER — Ambulatory Visit (HOSPITAL_COMMUNITY)
Admission: RE | Admit: 2019-06-02 | Discharge: 2019-06-02 | Disposition: A | Discharge status: Home / Self Care | Payer: Medicare Other | Source: Ambulatory Visit | Attending: Gastroenterology | Admitting: Gastroenterology

## 2019-06-02 ENCOUNTER — Other Ambulatory Visit: Payer: Self-pay

## 2019-06-02 ENCOUNTER — Other Ambulatory Visit: Payer: Self-pay | Admitting: Gastroenterology

## 2019-06-02 DIAGNOSIS — R933 Abnormal findings on diagnostic imaging of other parts of digestive tract: Secondary | ICD-10-CM

## 2019-07-01 ENCOUNTER — Other Ambulatory Visit (INDEPENDENT_AMBULATORY_CARE_PROVIDER_SITE_OTHER): Payer: Medicare Other

## 2019-07-01 ENCOUNTER — Ambulatory Visit (INDEPENDENT_AMBULATORY_CARE_PROVIDER_SITE_OTHER): Payer: Medicare Other | Admitting: Gastroenterology

## 2019-07-01 ENCOUNTER — Ambulatory Visit: Payer: Medicare Other | Admitting: Gastroenterology

## 2019-07-01 ENCOUNTER — Encounter: Payer: Self-pay | Admitting: Gastroenterology

## 2019-07-01 VITALS — BP 122/78 | HR 71 | Ht 68.0 in | Wt 216.1 lb

## 2019-07-01 DIAGNOSIS — Z86010 Personal history of colonic polyps: Secondary | ICD-10-CM

## 2019-07-01 DIAGNOSIS — R933 Abnormal findings on diagnostic imaging of other parts of digestive tract: Secondary | ICD-10-CM | POA: Insufficient documentation

## 2019-07-01 DIAGNOSIS — K5520 Angiodysplasia of colon without hemorrhage: Secondary | ICD-10-CM

## 2019-07-01 DIAGNOSIS — Z8711 Personal history of peptic ulcer disease: Secondary | ICD-10-CM | POA: Diagnosis not present

## 2019-07-01 DIAGNOSIS — K449 Diaphragmatic hernia without obstruction or gangrene: Secondary | ICD-10-CM

## 2019-07-01 DIAGNOSIS — Z8639 Personal history of other endocrine, nutritional and metabolic disease: Secondary | ICD-10-CM

## 2019-07-01 DIAGNOSIS — Z8719 Personal history of other diseases of the digestive system: Secondary | ICD-10-CM

## 2019-07-01 LAB — IBC + FERRITIN
Ferritin: 50.2 ng/mL (ref 22.0–322.0)
Iron: 106 ug/dL (ref 42–165)
Saturation Ratios: 24.3 % (ref 20.0–50.0)
Transferrin: 311 mg/dL (ref 212.0–360.0)

## 2019-07-01 LAB — CBC
HCT: 41.1 % (ref 39.0–52.0)
Hemoglobin: 13.6 g/dL (ref 13.0–17.0)
MCHC: 33 g/dL (ref 30.0–36.0)
MCV: 83.6 fl (ref 78.0–100.0)
Platelets: 238 10*3/uL (ref 150.0–400.0)
RBC: 4.92 Mil/uL (ref 4.22–5.81)
RDW: 15 % (ref 11.5–15.5)
WBC: 7.4 10*3/uL (ref 4.0–10.5)

## 2019-07-01 LAB — RETICULOCYTES
ABS Retic: 70420 cells/uL (ref 25000–9000)
Retic Ct Pct: 1.4 %

## 2019-07-01 NOTE — Patient Instructions (Signed)
Your provider has requested that you go to the basement level for lab work before leaving today. Press "B" on the elevator. The lab is located at the first door on the left as you exit the elevator.  If you are age 79 or older, your body mass index should be between 23-30. Your Body mass index is 32.86 kg/m. If this is out of the aforementioned range listed, please consider follow up with your Primary Care Provider.  If you are age 66 or younger, your body mass index should be between 19-25. Your Body mass index is 32.86 kg/m. If this is out of the aformentioned range listed, please consider follow up with your Primary Care Provider.    Due to recent changes in healthcare laws, you may see the results of your imaging and laboratory studies on MyChart before your provider has had a chance to review them.  We understand that in some cases there may be results that are confusing or concerning to you. Not all laboratory results come back in the same time frame and the provider may be waiting for multiple results in order to interpret others.  Please give [place] 48 hours in order for your provider to thoroughly review all the results before contacting the office for clarification of your results.   Thank you for choosing me and [facility].  Dr. Rush Landmark

## 2019-07-01 NOTE — Progress Notes (Signed)
H

## 2019-07-01 NOTE — Progress Notes (Signed)
GASTROENTEROLOGY OUTPATIENT CLINIC VISIT   Primary Care Provider Orpah Greek, MD [address] [place] [address] [phone]  Patient Profile: Paul Kelley is a 79 y.o. male with a pmh significant for hypertension, hyperlipidemia, colon polyps (adenomatous), chronic constipation, hemorrhoids, previous PUD, testicular cancer, status post AAA repair.  The patient presents to the [facility] for an evaluation and management of problem(s) noted below:  Problem List 1. History of iron deficiency   2. AVM (arteriovenous malformation) of colon   3. History of peptic ulcer disease   4. Abnormal endoscopy of upper gastrointestinal tract   5. Hx of constipation   6. Hiatal hernia   7. History of colonic polyps     History of Present Illness Please see initial consultation note and prior progress notes for full details of HPI.    Interval History The patient returns for scheduled follow-up.  The patient underwent an upper and lower endoscopy for further evaluation of his symptoms as well as his iron deficiency.  Thankfully had not been anemic when we last checked but he showed evidence of iron deficiency.  He was found to have an abnormality in the D1/D2 sweep but biopsies were unremarkable.  After further discussion with the patient and wife he had a prior history of ulcer disease of a small intestine and so the suspicion was that this was scarring from his previous ulcer disease.  An upper GI series was performed that did not show significant issues in regards to the passage of contrast flow although the stomach propulsion of foodstuffs was slower.  The patient denies any nausea or vomiting.  He does not have any early satiety or fullness.  He is not losing weight unintentionally.  The patient had on colonoscopy a tubular adenoma as well as 2 angioectasias 1 in the cecum and one in the rectum.  These were not coagulated as we did not have APC in the endoscopy  center.  Today, the patient feels well.  Energy wise he is doing well.  Constipation seems to be doing well on current dosing of laxative therapy as needed.  He continues to take once daily oral iron.  He is not taking aspirin.  His GERD symptoms are well controlled.  GI Review of Systems Positive as above Negative for abdominal pain, dysphagia, odynophagia, melena, hematochezia, change in bowel habits   Review of Systems General: Denies fevers/chills/weight loss unintentionally Cardiovascular: Denies chest pain Pulmonary: Denies shortness of breath Gastroenterological: See HPI Genitourinary: Denies darkened urine  Hematological: Denies easy bruising/bleeding Dermatological: Denies jaundice Psychological: Mood is stable   Medications Current Outpatient Medications  Medication Sig Dispense Refill  . Cyanocobalamin (B-12 PO) Take 1 tablet by mouth daily.    Marland Kitchen donepezil (ARICEPT) 10 MG tablet Take 1 tablet (10 mg total) by mouth at bedtime. 90 tablet 4  . ferrous gluconate (FERGON) 324 MG tablet Take 1 tablet (324 mg total) by mouth daily with breakfast. 30 tablet 3  . lisinopril-hydrochlorothiazide (ZESTORETIC) 20-25 MG tablet Take 1 tablet by mouth daily.    . memantine (NAMENDA) 10 MG tablet Take 1 tablet (10 mg total) by mouth 2 (two) times daily. 180 tablet 4  . omeprazole (PRILOSEC) 40 MG capsule Take 1 capsule (40 mg total) by mouth daily. 90 capsule 3  . simvastatin (ZOCOR) 40 MG tablet Take 40 mg by mouth every evening.    Marland Kitchen STOOL SOFTENER 100 MG capsule Take 200 mg by mouth daily.  No current facility-administered medications for this visit.    Allergies No Known Allergies  Histories Past Medical History:  Diagnosis Date  . Anemia   . Blood transfusion without reported diagnosis   . Cataract   . Colon polyps   . GERD (gastroesophageal reflux disease)   . HTN (hypertension)   . Hyperlipidemia   . Memory loss   . Testicular cancer ([place])    36 years ago   Past  Surgical History:  Procedure Laterality Date  . CATARACT EXTRACTION, BILATERAL Bilateral   . INGUINAL HERNIA REPAIR    . ORCHIECTOMY     Social History   Socioeconomic History  . Marital status: Married    Spouse name: Not on file  . Number of children: 3  . Years of education: 49  . Highest education level: High school graduate  Occupational History  . Occupation: retired  Tobacco Use  . Smoking status: Former Smoker    Types: Cigarettes    Quit date: 05/14/1968    Years since quitting: 51.1  . Smokeless tobacco: Never Used  Vaping Use  . Vaping Use: Never assessed  Substance and Sexual Activity  . Alcohol use: Not Currently  . Drug use: Never  . Sexual activity: Not on file  Other Topics Concern  . Not on file  Social History Narrative   Lives at home with his wife.   2-3 cups caffeine per day.   Right-handed.   Social Determinants of Health   Financial Resource Strain:   . Difficulty of Paying Living Expenses:   Food Insecurity:   . Worried About Charity fundraiser in the Last Year:   . Arboriculturist in the Last Year:   Transportation Needs:   . Film/video editor (Medical):   Marland Kitchen Lack of Transportation (Non-Medical):   Physical Activity:   . Days of Exercise per Week:   . Minutes of Exercise per Session:   Stress:   . Feeling of Stress :   Social Connections:   . Frequency of Communication with Friends and Family:   . Frequency of Social Gatherings with Friends and Family:   . Attends Religious Services:   . Active Member of Clubs or Organizations:   . Attends Archivist Meetings:   Marland Kitchen Marital Status:   Intimate Partner Violence:   . Fear of Current or Ex-Partner:   . Emotionally Abused:   Marland Kitchen Physically Abused:   . Sexually Abused:    Family History  Problem Relation Age of Onset  . Liver cancer Mother   . Heart disease Father 43       MI  . Diabetes Father   . Liver cancer Sister   . Cancer Brother        type unknown  . Heart  disease Brother 69       MI  . Colon cancer Neg Hx   . Esophageal cancer Neg Hx   . Inflammatory bowel disease Neg Hx   . Pancreatic cancer Neg Hx   . Rectal cancer Neg Hx   . Stomach cancer Neg Hx    I have reviewed his medical, social, and family history in detail and updated the electronic medical record as necessary.    PHYSICAL EXAMINATION  BP 122/78   Pulse 71   Ht 5\' 8"  (1.727 m)   Wt 216 lb 2 oz (98 kg)   BMI 32.86 kg/m  Wt Readings from Last 3 Encounters:  07/01/19 216 lb  2 oz (98 kg)  05/18/19 223 lb (101.2 kg)  03/05/19 223 lb 6 oz (101.3 kg)  GEN: NAD, appears stated age, doesn't appear chronically ill, accompanied by wife PSYCH: Cooperative, without pressured speech EYE: Conjunctivae pink, sclerae anicteric ENT: MMM CV: Nontachycardic RESP: No wheezing apparent GI: NABS, soft, protuberant, distended, midline surgical scar present, ventral hernia present, without rebound MSK/EXT: Trace bilateral lower extremity edema SKIN: No jaundice NEURO:  Alert & Oriented x 3, no focal deficits   REVIEW OF DATA  I reviewed the following data at the time of this encounter:  GI Procedures and Studies  April 2021 EGD - White nummular lesions in esophageal mucosa. Biopsied to rule out Candida. Otherwise no gross lesions in esophagus. - Schatzki ring noted. - Hiatal hernia noted. - Erythematous mucosa in the gastric body and antrum. Biopsied. - Moderate narrowing in the D1/D2 angle noted, traversed with ease. - No gross lesions in the duodenal bulb, in the first portion of the duodenum and in the second portion of the duodenum. Biopsied.  April 2021 colonoscopy - Hemorrhoids found on digital rectal exam. - The examined portion of the ileum was normal. - Two colonic angioectasias. - One 3 mm polyp in the cecum, removed with a cold snare. Resected and retrieved. - Diverticulosis in the recto-sigmoid colon, in the sigmoid colon and in the descending colon. - Mild  narrowing noted in setting of diverticulosis in the sigmoid colon. - Normal mucosa in the entire examined colon otherwise. - Non-bleeding non-thrombosed external and internal hemorrhoids.  Pathology 1. Surgical [P], duodenal bx's - GASTRIC HETEROTOPIA 2. Surgical [P], gastric antrum and gastric body - GASTRIC ANTRAL AND OXYNTIC MUCOSA WITH NO SPECIFIC HISTOPATHOLOGIC CHANGES - WARTHIN STARRY STAIN IS NEGATIVE FOR HELICOBACTER PYLORI 3. Surgical [P], esophageal bx's - ESOPHAGEAL SQUAMOUS MUCOSA WITH NO SPECIFIC HISTOPATHOLOGIC CHANGES - NEGATIVE FOR INCREASED INTRAEPITHELIAL EOSINOPHILS - PAS/F STAIN IS NEGATIVE FOR FUNGAL ORGANISMS 4. Surgical [P], colon, cecum, polyp - TUBULAR ADENOMA(S) - NEGATIVE FOR HIGH-GRADE DYSPLASIA OR MALIGNANCY  Laboratory Studies  Reviewed those labs in epic  Imaging Studies  May 2021 upper GI series IMPRESSION: 1. Small type 1 hiatal hernia. 2. At least 1 episode of gastroesophageal reflux was noted during the course of today's exam. 3. Mild fold thickening in the stomach antrum, low-grade antritis not excluded. No ulceration or mass identified. 4. Aortoiliac atherosclerosis. 5. No definite significant narrowing in the duodenum is identified, although slow emptying from the stomach causes the duodenum to remain fairly nondistended.   ASSESSMENT  Mr. Grajales is a 79 y.o. male with a pmh significant for hypertension, hyperlipidemia, colon polyps (adenomatous), chronic constipation, hemorrhoids, previous PUD, testicular cancer, status post AAA repair.  The patient is seen today for evaluation and management of:  1. History of iron deficiency   2. AVM (arteriovenous malformation) of colon   3. History of peptic ulcer disease   4. Abnormal endoscopy of upper gastrointestinal tract   5. Hx of constipation   6. Hiatal hernia   7. History of colonic polyps    The patient is hemodynamically and clinically stable.  We will see how his repeat blood counts  and iron indices show in regards to his history of iron deficiency.  With the 2 lesions noted in the colon, we may need to consider colonic APC in regards to trying to optimize his iron indices.  The patient may also need a video capsule endoscopy at some point.  I think for the patient however, that a  endoscopically placed capsule may be best for him in the setting of the angulation and somewhat narrowed area of the sweep if we ever consider capsule placement.  IV iron infusions can certainly be considered and will be something that we think about.  We discussed the potential risks of IV iron infusions even though they are rare at this time based on the most commonly used formulations it is something to be aware of.  He will remain on once daily oral iron for now.  He is doing well from a toileting technique perspective.  We have held on pelvic floor retraining and biofeedback as well as anorectal manometry for now.  No other significant changes will occur at this time.  All patient questions were answered, to the best of my ability, and the patient agrees to the aforementioned plan of action with follow-up as indicated.   PLAN  Laboratories as outlined below If evidence of persistent iron deficiency then will either consider double dose iron orally versus IV iron infusion and potentially repeat colonoscopy with APC ablation of the cecal and rectal angioectasias Video capsule endoscopy on hold for now Constipation regimen to use as needed -Dulcolax 5 to 10 mg daily -Docusate 200 mg daily -MiraLAX 1-2 times daily   Orders Placed This Encounter  Procedures  . CBC  . IBC + Ferritin  . Reticulocytes    New Prescriptions   No medications on file   Modified Medications   No medications on file    Planned Follow Up No follow-ups on file.   Total Time in Face-to-Face and in Coordination of Care for patient including independent/personal interpretation/review of prior testing, medical history,  examination, medication adjustment, communicating results with the patient directly, and documentation with the EHR is greater than 25 minutes.   Justice Britain, MD [facility] Advanced Endoscopy Office # [phone]

## 2019-07-21 ENCOUNTER — Ambulatory Visit: Payer: Medicare Other | Admitting: Gastroenterology

## 2019-07-28 ENCOUNTER — Ambulatory Visit: Payer: Medicare Other | Admitting: Neurology

## 2019-08-11 ENCOUNTER — Ambulatory Visit (INDEPENDENT_AMBULATORY_CARE_PROVIDER_SITE_OTHER): Payer: Medicare Other | Admitting: Neurology

## 2019-08-11 ENCOUNTER — Encounter: Payer: Self-pay | Admitting: Neurology

## 2019-08-11 VITALS — BP 125/65 | HR 71 | Ht 68.0 in | Wt 218.5 lb

## 2019-08-11 DIAGNOSIS — R413 Other amnesia: Secondary | ICD-10-CM

## 2019-08-11 NOTE — Progress Notes (Signed)
PATIENT: Paul Kelley DOB: December 02, 1940  Chief Complaint  Patient presents with  . Memory Loss    MMSE 26/30 - 9 animals. He is here with wife, Lelon Frohlich. They both feel his memory is stable.      HISTORICAL  Paul Kelley is a 79 year old male, seen in request by his primary care physician Dr. Sabra Heck, [place] Severin for evaluation of memory loss, initial evaluation was with his wife Lelon Frohlich on December 24, 2018.  I have reviewed and summarized the referring note from the referring physician.  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  He denies difficulty handling his job at that time, there was no family history of memory loss  He is going through a lot of stress, both his wife, and daughter are combating stage IV cancer, he was noted to have word finding difficulties, difficulty completing his sentences, when he read, he described difficulty concentrate, and retaining information's.  He tends to repeat himself, he is much less active, sedentary most of the time.  He still drives his wife to chemotherapy every 2 weeks  He had a history of testicular cancer, status post surgery followed by chemotherapy, laboratory evaluation in November 2020 showed CBC, hemoglobin of 12.8, ferritin of 22, normal B12, TSH, CMP  UPDATE Jan 4th 2021: He continue complains of mild memory loss, we personally reviewed MRI of the brain from [facility], mild generalized atrophy, no acute abnormality  He is under a lot of stress, both his wife and daughter suffered stage IV cancer  UPDATE August 11 2019: MRI of the brain with without contrast from [place] [facility] on January 14, 2019, no acute abnormality, mild white matter T2 hyperintensity, consistent with small vessel disease, and age-related global atrophy.  He tolerated namenda 10mg , aricept 10mg  qhs, wife  noticed he is more like himself, he can follow his recipes better, he can keep with his Sunday lesson letter  REVIEW OF SYSTEMS: Full 14 system review of systems performed and notable only for as above All other review of systems were negative.  ALLERGIES: No Known Allergies  HOME MEDICATIONS: Current Outpatient Medications  Medication Sig Dispense Refill  . Cyanocobalamin (B-12 PO) Take 1 tablet by mouth daily.    Marland Kitchen donepezil (ARICEPT) 10 MG tablet Take 1 tablet (10 mg total) by mouth at bedtime. 90 tablet 4  . ferrous gluconate (FERGON) 324 MG tablet Take 1 tablet (324 mg total) by mouth daily with breakfast. 30 tablet 3  . lisinopril-hydrochlorothiazide (ZESTORETIC) 20-25 MG tablet Take 1 tablet by mouth daily.    . memantine (NAMENDA) 10 MG tablet Take 1 tablet (10 mg total) by mouth 2 (two) times daily. 180 tablet 4  . omeprazole (PRILOSEC) 40 MG capsule Take 1 capsule (40 mg total) by mouth daily. 90 capsule 3  . simvastatin (ZOCOR) 40 MG tablet Take 40 mg by mouth every evening.    Marland Kitchen STOOL SOFTENER 100 MG capsule Take 200 mg by mouth daily.     No current facility-administered medications for this visit.    PAST MEDICAL HISTORY: Past Medical History:  Diagnosis Date  . Anemia   . Blood transfusion without reported diagnosis   . Cataract   . Colon polyps   . GERD (gastroesophageal reflux disease)   . HTN (hypertension)   . Hyperlipidemia   . Memory loss   .  Testicular cancer ([place])    36 years ago    PAST SURGICAL HISTORY: Past Surgical History:  Procedure Laterality Date  . CATARACT EXTRACTION, BILATERAL Bilateral   . INGUINAL HERNIA REPAIR    . ORCHIECTOMY      FAMILY HISTORY: Family History  Problem Relation Age of Onset  . Liver cancer Mother   . Heart disease Father 53       MI  . Diabetes Father   . Liver cancer Sister   . Cancer Brother        type unknown  . Heart disease Brother 34       MI  . Colon cancer Neg Hx   . Esophageal cancer Neg Hx     . Inflammatory bowel disease Neg Hx   . Pancreatic cancer Neg Hx   . Rectal cancer Neg Hx   . Stomach cancer Neg Hx     SOCIAL HISTORY: Social History   Socioeconomic History  . Marital status: Married    Spouse name: Not on file  . Number of children: 3  . Years of education: 10  . Highest education level: High school graduate  Occupational History  . Occupation: retired  Tobacco Use  . Smoking status: Former Smoker    Types: Cigarettes    Quit date: 05/14/1968    Years since quitting: 51.2  . Smokeless tobacco: Never Used  Vaping Use  . Vaping Use: Never assessed  Substance and Sexual Activity  . Alcohol use: Not Currently  . Drug use: Never  . Sexual activity: Not on file  Other Topics Concern  . Not on file  Social History Narrative   Lives at home with his wife.   2-3 cups caffeine per day.   Right-handed.   Social Determinants of Health   Financial Resource Strain:   . Difficulty of Paying Living Expenses:   Food Insecurity:   . Worried About Charity fundraiser in the Last Year:   . Arboriculturist in the Last Year:   Transportation Needs:   . Film/video editor (Medical):   Marland Kitchen Lack of Transportation (Non-Medical):   Physical Activity:   . Days of Exercise per Week:   . Minutes of Exercise per Session:   Stress:   . Feeling of Stress :   Social Connections:   . Frequency of Communication with Friends and Family:   . Frequency of Social Gatherings with Friends and Family:   . Attends Religious Services:   . Active Member of Clubs or Organizations:   . Attends Archivist Meetings:   Marland Kitchen Marital Status:   Intimate Partner Violence:   . Fear of Current or Ex-Partner:   . Emotionally Abused:   Marland Kitchen Physically Abused:   . Sexually Abused:      PHYSICAL EXAM   Vitals:   08/11/19 1302  BP: 125/65  Pulse: 71  Weight: 218 lb 8 oz (99.1 kg)  Height: 5\' 8"  (1.727 m)   Not recorded     Body mass index is 33.22 kg/m.  PHYSICAL  EXAMNIATION:  Gen: NAD, conversant, well nourised, well groomed                     Cardiovascular: Regular rate rhythm, no peripheral edema, warm, nontender. Eyes: Conjunctivae clear without exudates or hemorrhage Neck: Supple, no carotid bruits. Pulmonary: Clear to auscultation bilaterally   NEUROLOGICAL EXAM:  MMSE - Mini Mental State Exam 08/11/2019 12/24/2018  Orientation to  time 4 4  Orientation to Place 5 5  Registration 3 3  Attention/ Calculation 5 5  Recall 0 0  Language- name 2 objects 2 2  Language- repeat 1 1  Language- follow 3 step command 3 3  Language- read & follow direction 1 1  Write a sentence 1 1  Copy design 1 1  Total score 26 26  Animal Naming 9   CRANIAL NERVES: CN II: Visual fields are full to confrontation.  Pupils are round equal and briskly reactive to light. CN III, IV, VI: extraocular movement are normal. No ptosis. CN V: Facial sensation is intact to pinprick in all 3 divisions bilaterally. Corneal responses are intact.  CN VII: Face is symmetric with normal eye closure and smile. CN VIII: Hearing is normal to causal conversation. CN IX, X: Phonation is normal. CN XI: Head turning and shoulder shrug are intact  MOTOR: There is no pronator drift of out-stretched arms. Muscle bulk and tone are normal. Muscle strength is normal.  REFLEXES: Reflexes are 1 and symmetric at the biceps, triceps, knees, and absent at ankles. Plantar responses are flexor.  SENSORY: Length dependent decreased to light touch, pinprick and vibratory sensation to mid shin level.  COORDINATION: There is no trunk or limb dysmetria noted.  GAIT/STANCE: Can get up from seated position arms crossed, cautious, difficulty perform tandem walking  DIAGNOSTIC DATA (LABS, IMAGING, TESTING) - I reviewed patient records, labs, notes, testing and imaging myself where available.   ASSESSMENT AND PLAN  Paul Kelley is a 79 y.o. 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 Ph.D.  [facility] [address], [place], [place] [address] Ph: [phone] Fax: [phone]  CC: Orpah Greek, MD

## 2020-05-15 ENCOUNTER — Other Ambulatory Visit: Payer: Self-pay | Admitting: Gastroenterology

## 2020-05-15 DIAGNOSIS — K2970 Gastritis, unspecified, without bleeding: Secondary | ICD-10-CM

## 2020-05-15 DIAGNOSIS — D509 Iron deficiency anemia, unspecified: Secondary | ICD-10-CM

## 2020-08-14 ENCOUNTER — Other Ambulatory Visit: Payer: Self-pay | Admitting: Gastroenterology

## 2020-08-14 DIAGNOSIS — K2970 Gastritis, unspecified, without bleeding: Secondary | ICD-10-CM

## 2020-08-14 DIAGNOSIS — D509 Iron deficiency anemia, unspecified: Secondary | ICD-10-CM

## 2020-08-29 ENCOUNTER — Encounter: Payer: Self-pay | Admitting: Neurology

## 2020-08-29 ENCOUNTER — Ambulatory Visit (INDEPENDENT_AMBULATORY_CARE_PROVIDER_SITE_OTHER): Payer: Medicare Other | Admitting: Neurology

## 2020-08-29 ENCOUNTER — Other Ambulatory Visit: Payer: Self-pay

## 2020-08-29 VITALS — BP 120/70 | HR 69 | Ht 69.0 in | Wt 207.5 lb

## 2020-08-29 DIAGNOSIS — F0390 Unspecified dementia without behavioral disturbance: Secondary | ICD-10-CM

## 2020-09-09 ENCOUNTER — Encounter: Payer: Self-pay | Admitting: Neurology

## 2020-09-09 DIAGNOSIS — F0390 Unspecified dementia without behavioral disturbance: Secondary | ICD-10-CM | POA: Insufficient documentation

## 2020-09-09 NOTE — Progress Notes (Signed)
PATIENT: Paul Kelley DOB: 13-Jan-1941  Chief Complaint  Patient presents with   Memory Loss    Follow up: memory is little more 'foggier' Room 16, wife Webb Silversmith in room    [place] is a 80 y.o. 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  MRI of the brain without contrast January 14, 2019, no acute abnormality, mild small vessel disease and global atrophy  Laboratory evaluations in November 2020, normal or negative B12, CBC hemoglobin of 12.8,  ferritin of 38, TSH  HISTORICAL  Paul Kelley is a 81 year old male, seen in request by his primary care physician Dr. Sabra Heck, [place] Severin for evaluation of memory loss, initial evaluation was with his wife Lelon Frohlich on December 24, 2018.  I have reviewed and summarized the referring note from the referring physician.  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  He denies difficulty handling his job at that time, there was no family history of memory loss  He is going through a lot of stress, both his wife, and daughter are combating stage IV cancer, he was noted to have word finding difficulties, difficulty completing his sentences, when he read, he described difficulty concentrate, and retaining  information's.  He tends to repeat himself, he is much less active, sedentary most of the time.  He still drives his wife to chemotherapy every 2 weeks  He had a history of testicular cancer, status post surgery followed by chemotherapy, laboratory evaluation in November 2020 showed CBC, hemoglobin of 12.8, ferritin of 22, normal B12, TSH, CMP  UPDATE Jan 4th 2021: He continue complains of mild memory loss, we personally reviewed MRI of the brain from [facility], mild generalized atrophy, no acute abnormality  He is under a lot of stress, both his wife and daughter suffered stage IV cancer  UPDATE August 11 2019: MRI of the brain with without contrast from [facility] on January 14, 2019, no acute abnormality, mild white matter T2 hyperintensity, consistent with small vessel disease, and age-related global atrophy.  He tolerated namenda '10mg'$ , aricept '10mg'$  qhs, wife noticed he is more like himself, he can follow his recipes better, he can keep with his Sunday lesson letter  Update August 29, 2020 He is accompanied by his wife at today's visit, continue complains slow worsening memory loss, MoCA examination is only 10 out of 30 today, needing more help in his daily activity  PHYSICAL EXAM   Vitals:   08/29/20 0740  BP: 120/70  Pulse: 69  Weight: 207 lb 8 oz (94.1 kg)  Height: '5\' 9"'$  (1.753 m)   Not recorded     Body mass index is 30.64 kg/m.  PHYSICAL EXAMNIATION:  Gen: NAD, conversant, well nourised, well groomed        NEUROLOGICAL EXAM:  MMSE - Mini Mental State Exam 08/11/2019 12/24/2018  Orientation to time 4 4  Orientation to Place 5 5  Registration 3 3  Attention/ Calculation 5 5  Recall 0 0  Language- name 2 objects 2 2  Language- repeat 1 1  Language- follow 3 step command 3 3  Language- read & follow direction 1 1  Write a sentence 1 1  Copy design 1 1  Total score 26 26  Animal Naming 9   Montreal Cognitive Assessment  08/29/2020   Visuospatial/ Executive (0/5) 0  Naming (0/3) 3  Attention: Read list of digits (0/2) 1  Attention: Read list of letters (0/1) 0  Attention: Serial 7 subtraction starting at 100 (0/3) 0  Language: Repeat phrase (0/2) 1  Language : Fluency (0/1) 0  Abstraction (0/2) 2  Delayed Recall (0/5) 0  Orientation (0/6) 3  Total 10    CRANIAL NERVES: CN II: Visual fields are full to confrontation.  Pupils are round equal and briskly reactive to light. CN III, IV, VI: extraocular movement are normal. No ptosis. CN V: Facial sensation is intact to pinprick in all 3 divisions bilaterally. Corneal responses are intact.  CN VII: Face is symmetric with normal eye closure and smile. CN VIII: Hearing is normal to causal conversation. CN IX, X: Phonation is normal. CN XI: Head turning and shoulder shrug are intact  MOTOR: Moving 4 extremity without difficulty  REFLEXES: Reflexes are 1 and symmetric at the biceps, triceps, knees, and absent at ankles. Plantar responses are flexor.  SENSORY: Length dependent decreased to light touch, pinprick and vibratory sensation to mid shin level.  COORDINATION: There is no trunk or limb dysmetria noted.  GAIT/STANCE: Can get up from seated position arms crossed, cautious, difficulty perform tandem walking  REVIEW OF SYSTEMS: Full 14 system review of systems performed and notable only for as above All other review of systems were negative.  ALLERGIES: No Known Allergies  HOME MEDICATIONS: Current Outpatient Medications  Medication Sig Dispense Refill   Cyanocobalamin (B-12 PO) Take 1 tablet by mouth daily.     donepezil (ARICEPT) 10 MG tablet Take 1 tablet (10 mg total) by mouth at bedtime. 90 tablet 4   fenofibrate 54 MG tablet Take 54 mg by mouth daily.     ferrous gluconate (FERGON) 324 MG tablet Take 1 tablet (324 mg total) by mouth daily with breakfast. 30 tablet 3   lisinopril-hydrochlorothiazide (ZESTORETIC) 20-25 MG tablet Take 1 tablet by  mouth daily.     memantine (NAMENDA) 10 MG tablet Take 1 tablet (10 mg total) by mouth 2 (two) times daily. 180 tablet 4   omeprazole (PRILOSEC) 40 MG capsule TAKE 1 CAPSULE BY MOUTH ONCE DAILY *NEED APPOINTMENT FOR REFILLS 90 capsule 0   STOOL SOFTENER 100 MG capsule Take 200 mg by mouth daily.     No current facility-administered medications for this visit.    PAST MEDICAL HISTORY: Past Medical History:  Diagnosis Date   Anemia    Blood transfusion without reported diagnosis    Cataract    Colon polyps    GERD (gastroesophageal reflux disease)    HTN (hypertension)    Hyperlipidemia    Memory loss    Testicular cancer ([place])    36 years ago    PAST SURGICAL HISTORY: Past Surgical History:  Procedure Laterality Date   CATARACT EXTRACTION, BILATERAL Bilateral    INGUINAL HERNIA REPAIR     ORCHIECTOMY      FAMILY HISTORY: Family  History  Problem Relation Age of Onset   Liver cancer Mother    Heart disease Father 24       MI   Diabetes Father    Liver cancer Sister    Cancer Brother        type unknown   Heart disease Brother 45       MI   Colon cancer Neg Hx    Esophageal cancer Neg Hx    Inflammatory bowel disease Neg Hx    Pancreatic cancer Neg Hx    Rectal cancer Neg Hx    Stomach cancer Neg Hx     SOCIAL HISTORY: Social History   Socioeconomic History   Marital status: Married    Spouse name: Webb Silversmith   Number of children: 3   Years of education: 12   Highest education level: High school graduate  Occupational History   Occupation: retired  Tobacco Use   Smoking status: Former    Types: Cigarettes    Quit date: 05/14/1968    Years since quitting: 52.3   Smokeless tobacco: Never  Vaping Use   Vaping Use: Not on file  Substance and Sexual Activity   Alcohol use: Not Currently   Drug use: Never   Sexual activity: Not on file  Other Topics Concern   Not on file  Social History Narrative   Lives at home with his wife.   2-3 cups caffeine per day.    Right-handed.   Social Determinants of Health   Financial Resource Strain: Not on file  Food Insecurity: Not on file  Transportation Needs: Not on file  Physical Activity: Not on file  Stress: Not on file  Social Connections: Not on file  Intimate Partner Violence: Not on file       Marcial Pacas, M.D. Ph.D.  [facility] [address] [place], [place] [address] Ph: [phone] Fax: [phone]  CC: Orpah Greek, MD

## 2020-11-17 ENCOUNTER — Other Ambulatory Visit: Payer: Self-pay | Admitting: Gastroenterology

## 2020-11-17 DIAGNOSIS — K2970 Gastritis, unspecified, without bleeding: Secondary | ICD-10-CM

## 2020-11-17 DIAGNOSIS — D509 Iron deficiency anemia, unspecified: Secondary | ICD-10-CM

## 2020-11-24 ENCOUNTER — Other Ambulatory Visit: Payer: Self-pay | Admitting: Gastroenterology

## 2020-11-24 DIAGNOSIS — D509 Iron deficiency anemia, unspecified: Secondary | ICD-10-CM

## 2020-11-24 DIAGNOSIS — K2970 Gastritis, unspecified, without bleeding: Secondary | ICD-10-CM

## 2021-03-11 IMAGING — RF DG UGI W/ HIGH DENSITY W/O KUB
12 of 19 series · 12 of 24 positions shown · non-contrast
Comparison: Abdominal radiograph of 10/30/2018

CLINICAL DATA: Duodenal narrowing on EGD

EXAM:
UPPER GI SERIES WITH KUB
TECHNIQUE: After obtaining a scout radiograph a routine upper GI series was
performed using thin and high density barium.
FLUOROSCOPY TIME:  Fluoroscopy Time:  4 minutes, 6 seconds
Radiation Exposure Index (if provided by the fluoroscopic device):
64.4 mGy
Number of Acquired Spot Images: 4

[Series 2: cp_standard · 0.36mm/px · 1 of 38 frames shown (1 of 11)]
[frame 20/38]
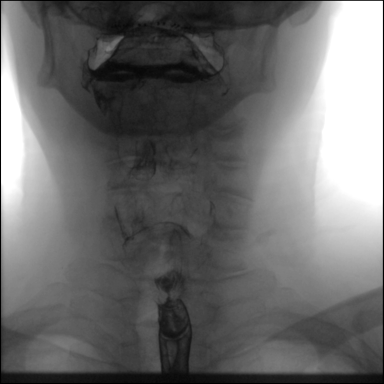

[Series 4: cp_standard · 0.36mm/px · 1 of 91 frames shown (2 of 11)]
[frame 14/91]
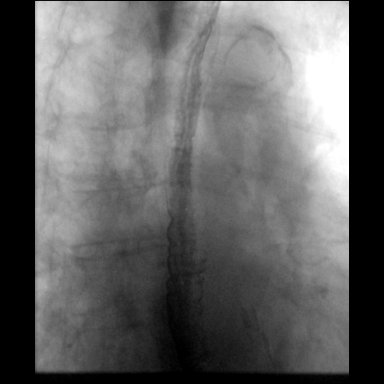

[Series 6: fluoro_barium 2fps_bw · 0.18mm/px · 1 of 1 slices shown]
[im 1/1]
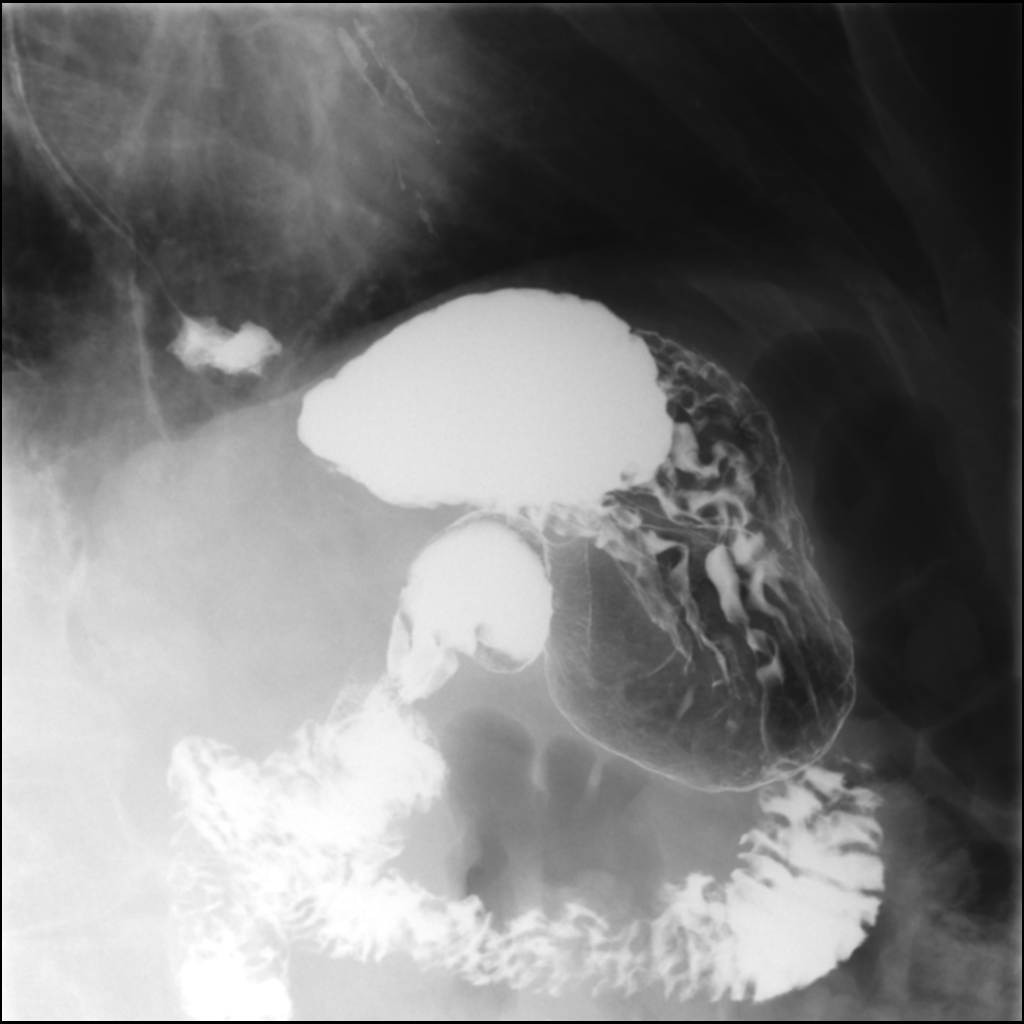

[Series 10: cp_standard · 0.35mm/px · 1 of 36 frames shown (3 of 11)]
[frame 19/36]
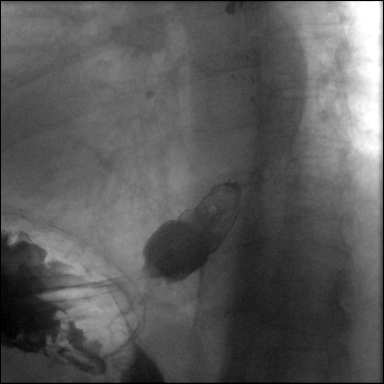

[Series 12: cp_standard · 0.35mm/px · 1 of 42 frames shown (4 of 11)]
[frame 7/42]
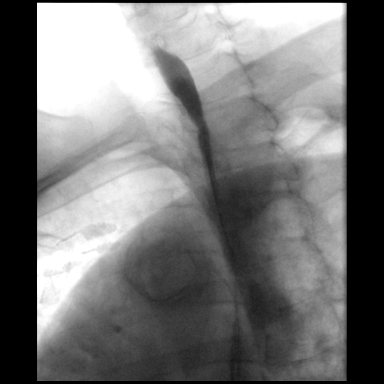

[Series 13: cp_standard · 0.35mm/px · 1 of 19 frames shown (5 of 11)]
[frame 17/19]
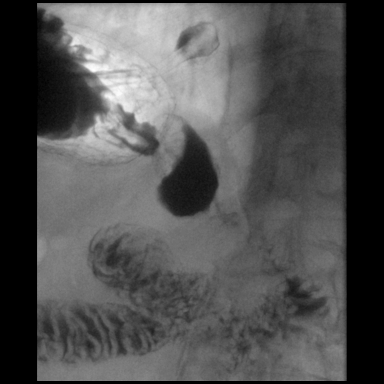

[Series 15: cp_standard · 0.35mm/px · 1 of 11 frames shown (6 of 11)]
[frame 10/11]
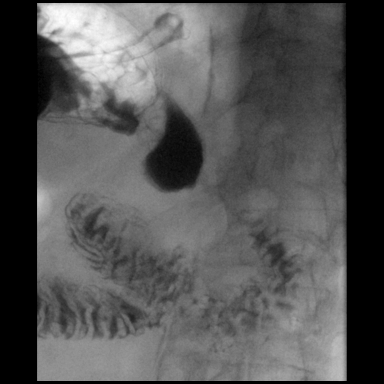

[Series 17: cp_standard · 0.35mm/px · 1 of 5 frames shown (7 of 11)]
[frame 3/5]
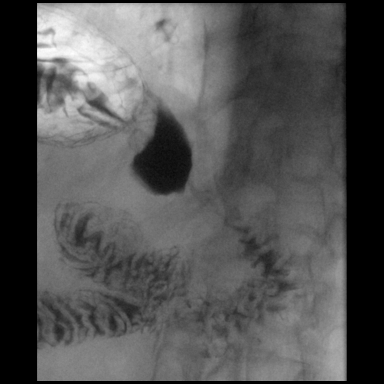

[Series 19: cp_standard · 0.35mm/px · 1 of 28 frames shown (8 of 11)]
[frame 5/28]
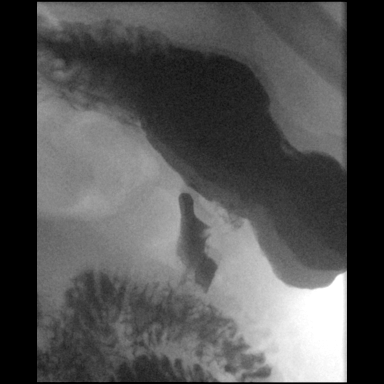

[Series 20: cp_standard · 0.35mm/px · 1 of 72 frames shown (9 of 11)]
[frame 37/72]
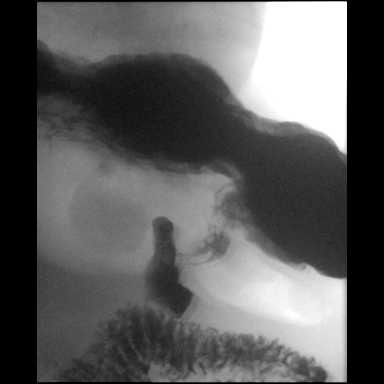

[Series 22: cp_standard · 0.35mm/px · 1 of 60 frames shown (10 of 11)]
[frame 10/60]
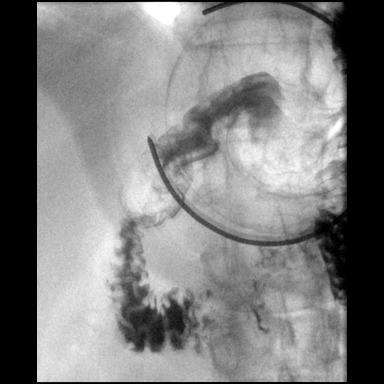

[Series 23: cp_standard · 0.35mm/px · 1 of 17 frames shown (11 of 11)]
[frame 15/17]
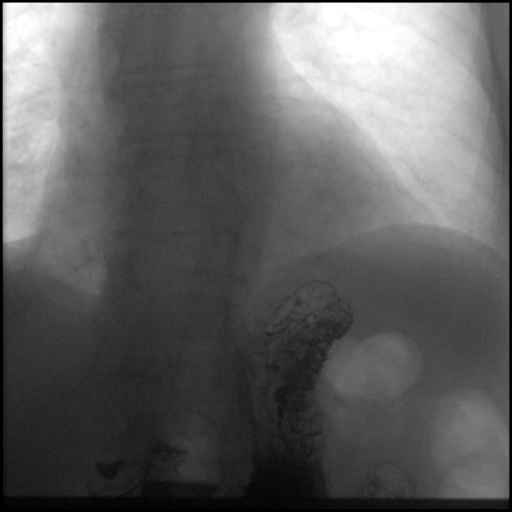

[12 of 24 positions shown; findings below may reference images not displayed]

FINDINGS: Initial KUB demonstrates aortoiliac atherosclerosis.

The pharyngeal phase of swallowing appears normal.

Occasional secondary and tertiary contractions in the mid to distal
thoracic esophagus. Small type 1 hiatal hernia. Primary peristaltic
waves were not disrupted. At least 1 episode of gastroesophageal
reflux was noted during the course of today's exam. There is a
widely patent (greater than 2 cm) distal esophageal mucosal ring.

There is a suggestion of mild fold thickening in the stomach antrum,
low-grade antritis not excluded.

Contractions in the proximal duodenum or scant, and accordingly the
junction from the 1st to 2nd portion of the duodenum was never fully
distended despite turning the patient on his right side. No definite
ulceration or lesion in this vicinity to correlate with the
narrowing at the angle between the duodenal bulb and descending
duodenum noted on endoscopy.
IMPRESSION: 1. Small type 1 hiatal hernia.
2. At least 1 episode of gastroesophageal reflux was noted during
the course of today's exam.
3. Mild fold thickening in the stomach antrum, low-grade antritis
not excluded. No ulceration or mass identified.
4. Aortoiliac atherosclerosis.
5. No definite significant narrowing in the duodenum is identified,
although slow emptying from the stomach causes the duodenum to
remain fairly nondistended.

## 2022-04-22 DEATH — deceased
